# Patient Record
Sex: Female | Born: 1945 | Race: White | Hispanic: No | Marital: Single | Smoking: Never smoker
Health system: Southern US, Community
[De-identification: ages and names within clinical notes are randomized; demographics above are authoritative.]

## PROBLEM LIST (undated history)

## (undated) DIAGNOSIS — E7800 Pure hypercholesterolemia, unspecified: Secondary | ICD-10-CM

## (undated) DIAGNOSIS — I10 Essential (primary) hypertension: Secondary | ICD-10-CM

## (undated) DIAGNOSIS — E039 Hypothyroidism, unspecified: Secondary | ICD-10-CM

## (undated) HISTORY — PX: TONSILLECTOMY: SUR1361

## (undated) HISTORY — PX: DILATION AND CURETTAGE OF UTERUS: SHX78

## (undated) HISTORY — PX: DIAGNOSTIC LAPAROSCOPY: SUR761

---

## 1997-12-06 ENCOUNTER — Other Ambulatory Visit: Admission: RE | Admit: 1997-12-06 | Discharge: 1997-12-06 | Payer: Self-pay | Admitting: Obstetrics and Gynecology

## 1998-12-18 ENCOUNTER — Other Ambulatory Visit: Admission: RE | Admit: 1998-12-18 | Discharge: 1998-12-18 | Payer: Self-pay | Admitting: Obstetrics and Gynecology

## 1999-06-30 ENCOUNTER — Encounter: Admission: RE | Admit: 1999-06-30 | Discharge: 1999-06-30 | Payer: Self-pay | Admitting: Obstetrics and Gynecology

## 1999-06-30 ENCOUNTER — Encounter: Payer: Self-pay | Admitting: Obstetrics and Gynecology

## 1999-12-18 ENCOUNTER — Other Ambulatory Visit: Admission: RE | Admit: 1999-12-18 | Discharge: 1999-12-18 | Payer: Self-pay | Admitting: Obstetrics and Gynecology

## 2000-10-12 ENCOUNTER — Encounter: Payer: Self-pay | Admitting: Obstetrics and Gynecology

## 2000-10-12 ENCOUNTER — Encounter: Admission: RE | Admit: 2000-10-12 | Discharge: 2000-10-12 | Payer: Self-pay | Admitting: Obstetrics and Gynecology

## 2000-12-09 ENCOUNTER — Other Ambulatory Visit: Admission: RE | Admit: 2000-12-09 | Discharge: 2000-12-09 | Payer: Self-pay | Admitting: Obstetrics and Gynecology

## 2001-10-16 ENCOUNTER — Encounter: Admission: RE | Admit: 2001-10-16 | Discharge: 2001-10-16 | Payer: Self-pay | Admitting: Obstetrics and Gynecology

## 2001-10-16 ENCOUNTER — Encounter: Payer: Self-pay | Admitting: Obstetrics and Gynecology

## 2001-12-06 ENCOUNTER — Other Ambulatory Visit: Admission: RE | Admit: 2001-12-06 | Discharge: 2001-12-06 | Payer: Self-pay | Admitting: Obstetrics and Gynecology

## 2002-01-11 ENCOUNTER — Other Ambulatory Visit: Admission: RE | Admit: 2002-01-11 | Discharge: 2002-01-11 | Payer: Self-pay | Admitting: Obstetrics and Gynecology

## 2002-08-27 ENCOUNTER — Other Ambulatory Visit: Admission: RE | Admit: 2002-08-27 | Discharge: 2002-08-27 | Payer: Self-pay | Admitting: Family Medicine

## 2002-09-03 ENCOUNTER — Encounter: Admission: RE | Admit: 2002-09-03 | Discharge: 2002-09-03 | Payer: Self-pay | Admitting: Family Medicine

## 2002-09-03 ENCOUNTER — Encounter: Payer: Self-pay | Admitting: Family Medicine

## 2002-10-18 ENCOUNTER — Encounter: Admission: RE | Admit: 2002-10-18 | Discharge: 2002-10-18 | Payer: Self-pay | Admitting: Family Medicine

## 2002-10-18 ENCOUNTER — Encounter: Payer: Self-pay | Admitting: Family Medicine

## 2003-09-10 ENCOUNTER — Other Ambulatory Visit: Admission: RE | Admit: 2003-09-10 | Discharge: 2003-09-10 | Payer: Self-pay | Admitting: Family Medicine

## 2003-10-21 ENCOUNTER — Encounter: Admission: RE | Admit: 2003-10-21 | Discharge: 2003-10-21 | Payer: Self-pay | Admitting: Family Medicine

## 2003-10-24 ENCOUNTER — Encounter: Admission: RE | Admit: 2003-10-24 | Discharge: 2003-10-24 | Payer: Self-pay | Admitting: Family Medicine

## 2004-07-27 ENCOUNTER — Encounter: Admission: RE | Admit: 2004-07-27 | Discharge: 2004-07-27 | Payer: Self-pay | Admitting: Family Medicine

## 2004-09-10 ENCOUNTER — Other Ambulatory Visit: Admission: RE | Admit: 2004-09-10 | Discharge: 2004-09-10 | Payer: Self-pay | Admitting: Family Medicine

## 2004-09-25 ENCOUNTER — Encounter: Admission: RE | Admit: 2004-09-25 | Discharge: 2004-09-25 | Payer: Self-pay | Admitting: Gastroenterology

## 2004-10-28 ENCOUNTER — Encounter: Admission: RE | Admit: 2004-10-28 | Discharge: 2004-10-28 | Payer: Self-pay | Admitting: Family Medicine

## 2005-11-08 ENCOUNTER — Other Ambulatory Visit: Admission: RE | Admit: 2005-11-08 | Discharge: 2005-11-08 | Payer: Self-pay | Admitting: Family Medicine

## 2005-11-24 ENCOUNTER — Encounter: Admission: RE | Admit: 2005-11-24 | Discharge: 2005-11-24 | Payer: Self-pay | Admitting: Family Medicine

## 2006-11-29 ENCOUNTER — Encounter: Admission: RE | Admit: 2006-11-29 | Discharge: 2006-11-29 | Payer: Self-pay | Admitting: Endocrinology

## 2006-12-02 ENCOUNTER — Other Ambulatory Visit: Admission: RE | Admit: 2006-12-02 | Discharge: 2006-12-02 | Payer: Self-pay | Admitting: Family Medicine

## 2006-12-20 ENCOUNTER — Encounter (INDEPENDENT_AMBULATORY_CARE_PROVIDER_SITE_OTHER): Payer: Self-pay | Admitting: Interventional Radiology

## 2006-12-20 ENCOUNTER — Encounter: Admission: RE | Admit: 2006-12-20 | Discharge: 2006-12-20 | Payer: Self-pay | Admitting: Endocrinology

## 2006-12-20 ENCOUNTER — Other Ambulatory Visit: Admission: RE | Admit: 2006-12-20 | Discharge: 2006-12-20 | Payer: Self-pay | Admitting: Interventional Radiology

## 2007-07-21 ENCOUNTER — Encounter: Admission: RE | Admit: 2007-07-21 | Discharge: 2007-07-21 | Payer: Self-pay | Admitting: Endocrinology

## 2007-11-27 ENCOUNTER — Encounter: Admission: RE | Admit: 2007-11-27 | Discharge: 2007-11-27 | Payer: Self-pay | Admitting: Endocrinology

## 2007-12-05 ENCOUNTER — Encounter: Admission: RE | Admit: 2007-12-05 | Discharge: 2007-12-05 | Payer: Self-pay | Admitting: Family Medicine

## 2007-12-20 ENCOUNTER — Other Ambulatory Visit: Admission: RE | Admit: 2007-12-20 | Discharge: 2007-12-20 | Payer: Self-pay | Admitting: Family Medicine

## 2008-12-06 ENCOUNTER — Encounter: Admission: RE | Admit: 2008-12-06 | Discharge: 2008-12-06 | Payer: Self-pay | Admitting: Family Medicine

## 2008-12-20 IMAGING — US US SOFT TISSUE HEAD/NECK
1 series · 13 of 25 positions shown · non-contrast
Comparison: none

CLINICAL DATA: Goiter.  Please evaluate. 
 THYROID ULTRASOUND:
TECHNIQUE: Ultrasound examination of the thyroid gland and adjacent soft tissue structures was performed.

[Series 1: us soft tissue head/neck · 0.08mm/px · 13 of 45 slices shown]
[im 1/45]
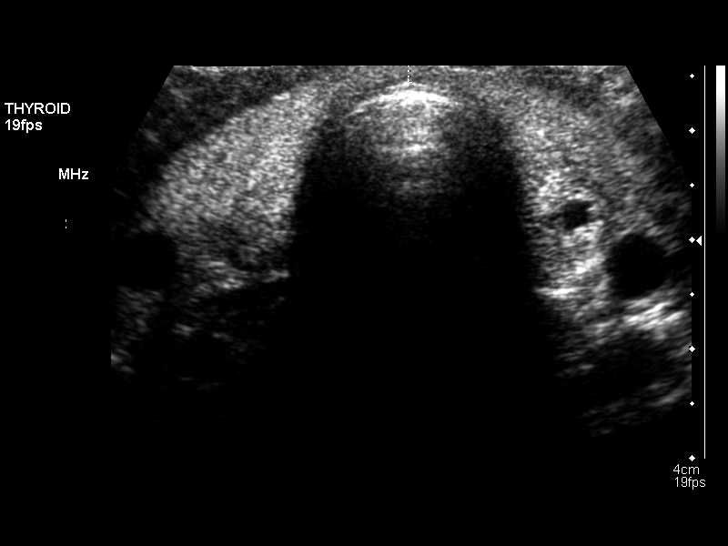
[im 4/45]
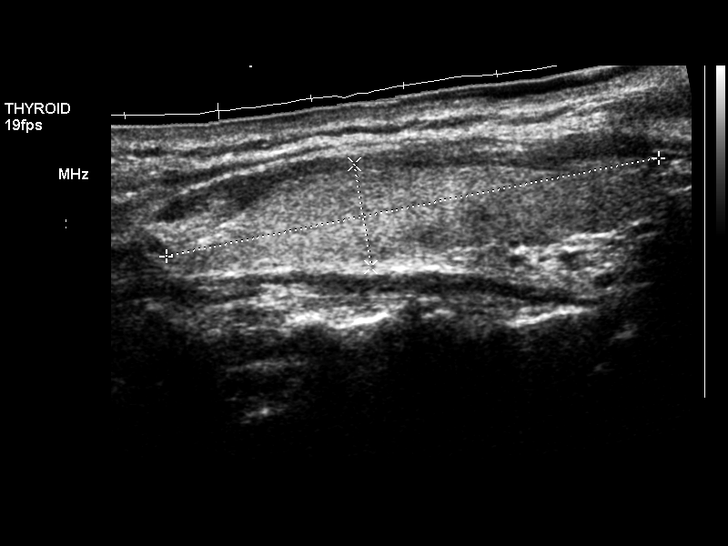
[im 8/45]
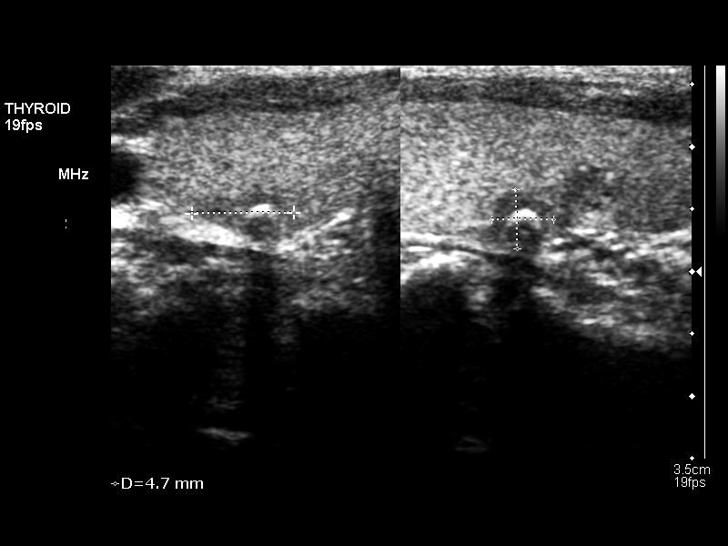
[im 12/45]
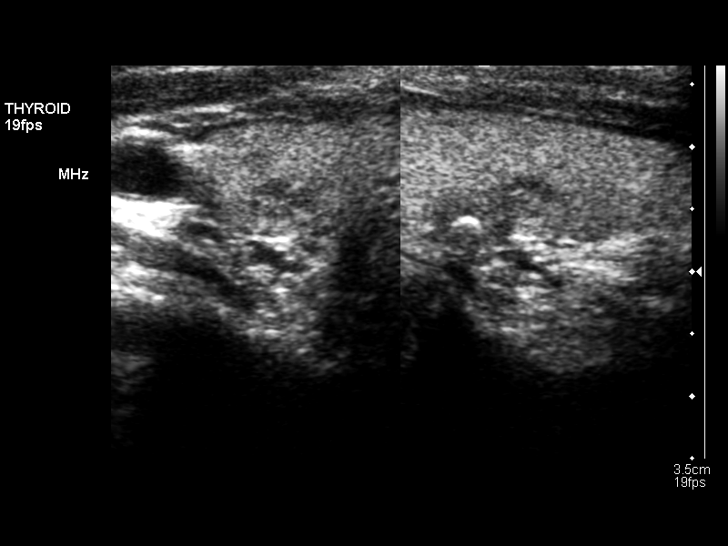
[im 15/45]
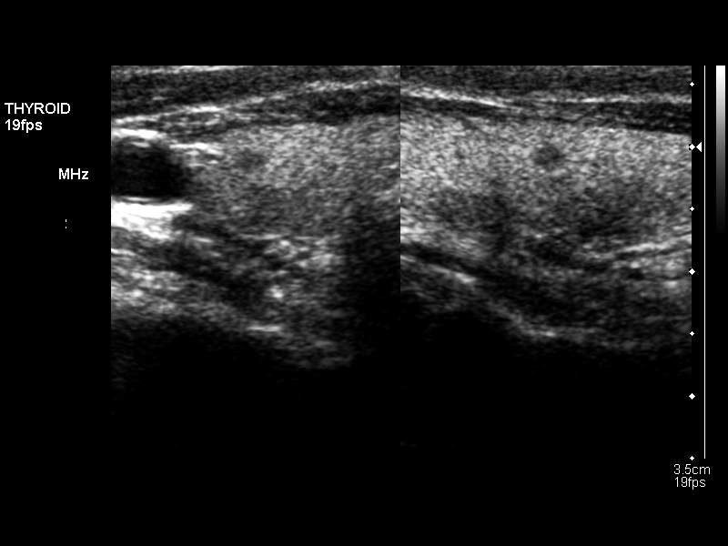
[im 19/45]
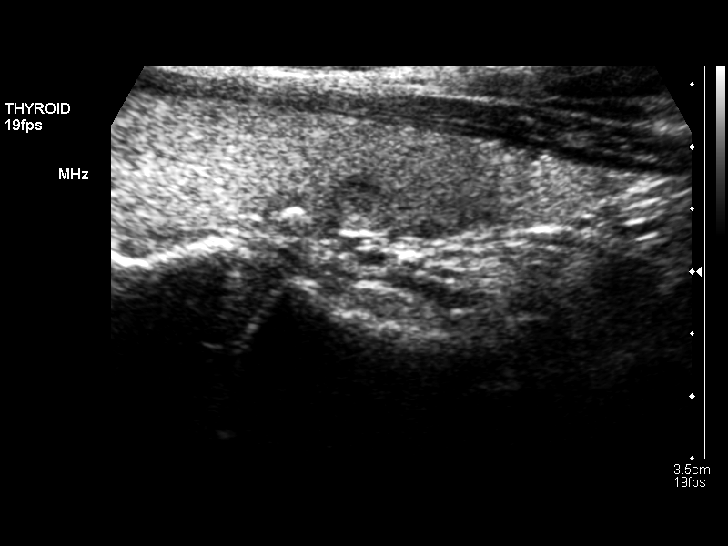
[im 23/45]
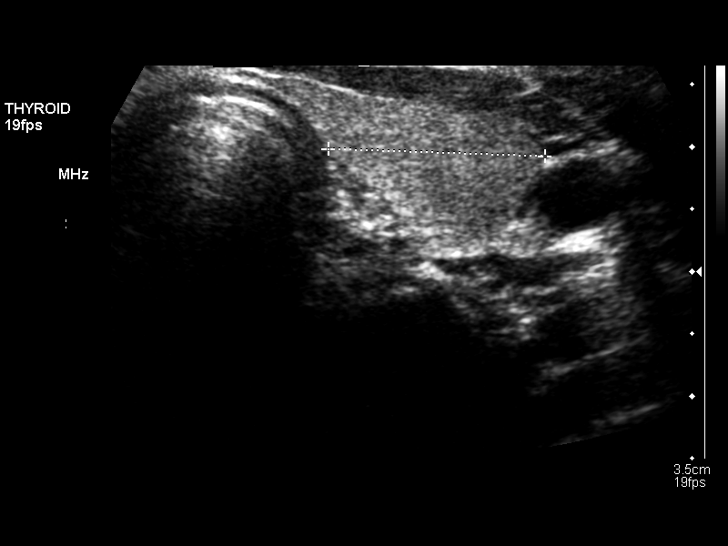
[im 26/45]
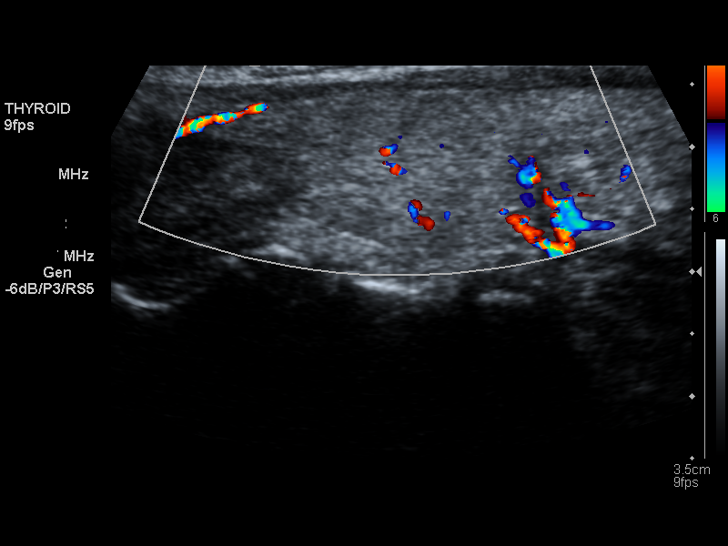
[im 30/45]
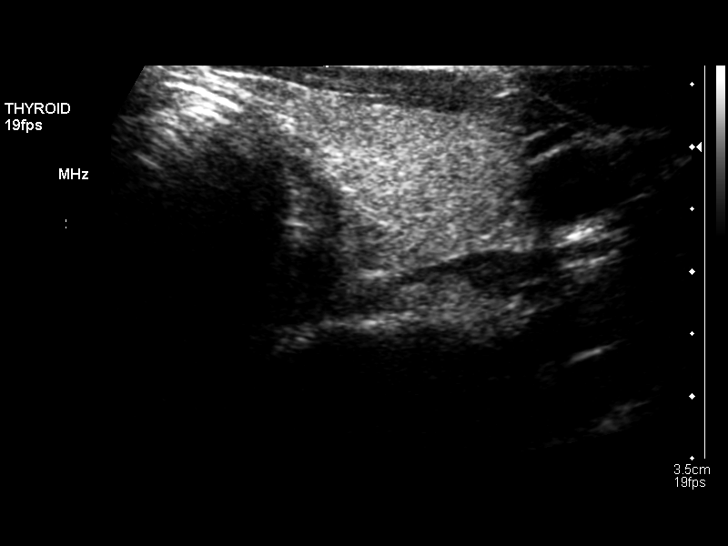
[im 34/45]
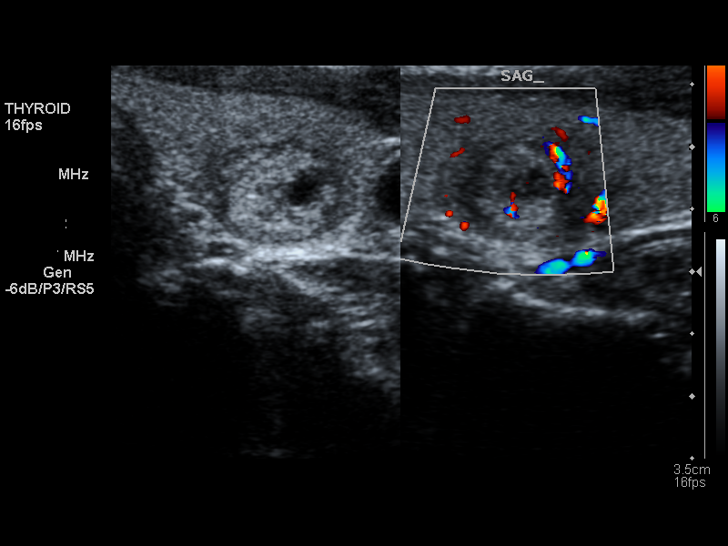
[im 37/45]
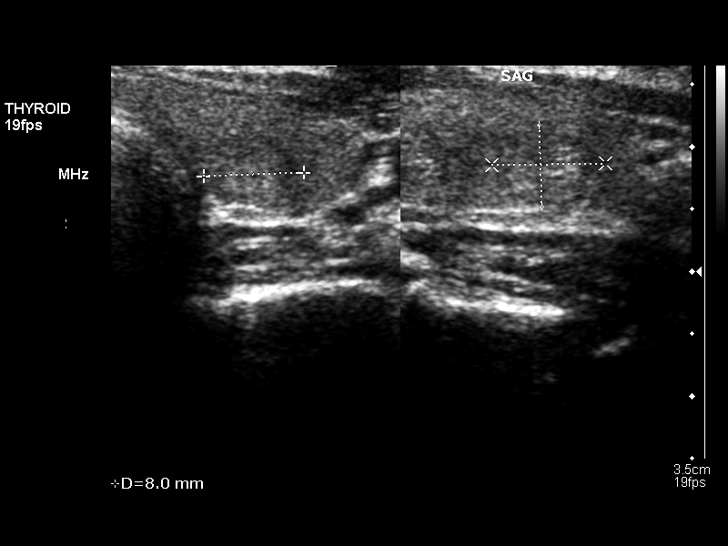
[im 41/45]
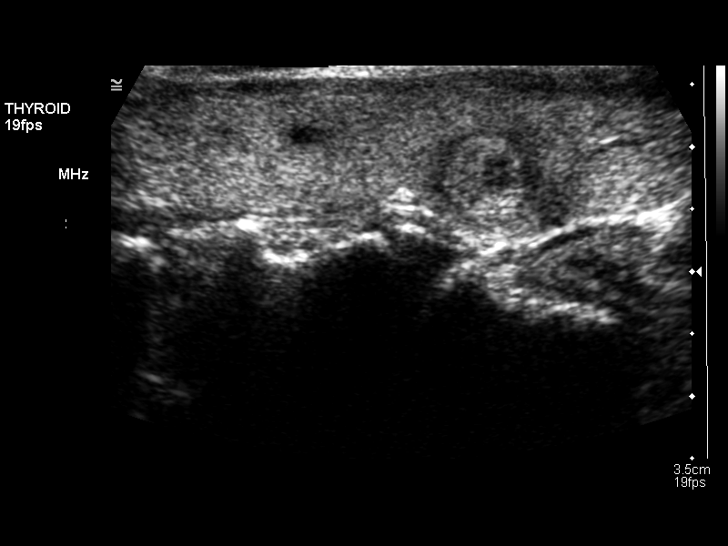
[im 45/45]
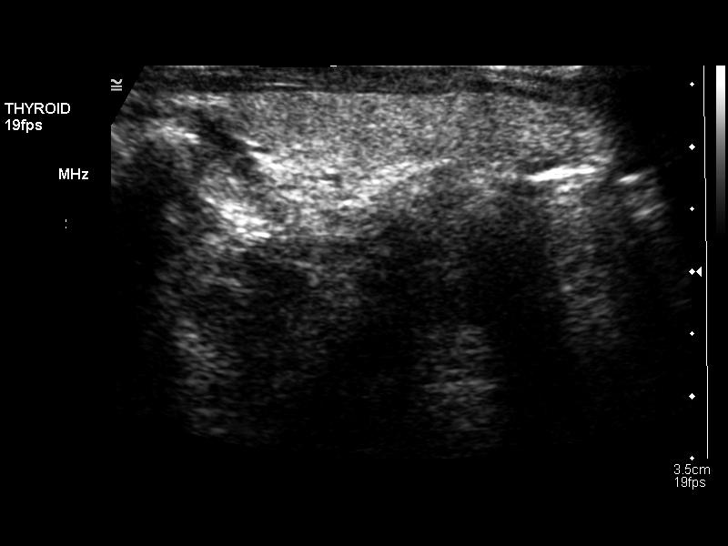

[13 of 25 positions shown; findings below may reference images not displayed]

FINDINGS: There is a multinodular gland present.  The right lobe of the thyroid measures 5.3 x 1.1 x 1.5 cm in size and the left lobe 4.8 x 1.2 x 1.7 cm in size.  Multiple nodules are present within the gland bilaterally.  The largest nodule within the left lobe is within the mid portion of the gland and is a complex nodule measuring 1.4 x 1.2 x 1.0 cm  in size.  A smaller solid nodule measuring 9 x 7 x 8 mm in size is present within the lower pole of the left lobe of the thyroid and there is a small (3 x 4 x 2 mm in size) solid nodule within the upper pole of the left lobe of the thyroid.  The largest nodule within the right lobe of the thyroid is a complex solid nodule within the lower pole laterally located measuring 8 x 5 x 5 mm in size.  This does contain an area of central calcification.  There is a solid 6 x 6 x 6 mm in size nodule within the right lower pole more medially located and a tiny nodule within the mid to lower pole portion measuring 3 x 3 x 3 mm in size.
IMPRESSION: Multinodular gland as discussed above.  The largest nodule is a complex nodule associated with the mid portion of the left lobe of the thyroid and measures 1.4 x 1.2 x 1.0 cm in size.

## 2009-01-02 ENCOUNTER — Other Ambulatory Visit: Admission: RE | Admit: 2009-01-02 | Discharge: 2009-01-02 | Payer: Self-pay | Admitting: Family Medicine

## 2009-04-08 ENCOUNTER — Encounter: Admission: RE | Admit: 2009-04-08 | Discharge: 2009-04-08 | Payer: Self-pay | Admitting: Endocrinology

## 2009-12-23 ENCOUNTER — Encounter
Admission: RE | Admit: 2009-12-23 | Discharge: 2009-12-23 | Payer: Self-pay | Source: Home / Self Care | Attending: Family Medicine | Admitting: Family Medicine

## 2010-01-30 ENCOUNTER — Encounter
Admission: RE | Admit: 2010-01-30 | Discharge: 2010-01-30 | Payer: Self-pay | Source: Home / Self Care | Attending: Endocrinology | Admitting: Endocrinology

## 2010-12-30 ENCOUNTER — Other Ambulatory Visit: Payer: Self-pay | Admitting: Family Medicine

## 2010-12-30 DIAGNOSIS — Z1231 Encounter for screening mammogram for malignant neoplasm of breast: Secondary | ICD-10-CM

## 2011-01-04 ENCOUNTER — Ambulatory Visit
Admission: RE | Admit: 2011-01-04 | Discharge: 2011-01-04 | Disposition: A | Discharge status: Home / Self Care | Payer: 59 | Source: Ambulatory Visit | Attending: Family Medicine | Admitting: Family Medicine

## 2011-01-04 DIAGNOSIS — Z1231 Encounter for screening mammogram for malignant neoplasm of breast: Secondary | ICD-10-CM

## 2011-01-28 ENCOUNTER — Other Ambulatory Visit: Payer: Self-pay | Admitting: Endocrinology

## 2011-01-28 DIAGNOSIS — E041 Nontoxic single thyroid nodule: Secondary | ICD-10-CM

## 2011-02-05 ENCOUNTER — Ambulatory Visit
Admission: RE | Admit: 2011-02-05 | Discharge: 2011-02-05 | Disposition: A | Discharge status: Home / Self Care | Payer: 59 | Source: Ambulatory Visit | Attending: Endocrinology | Admitting: Endocrinology

## 2011-02-05 DIAGNOSIS — E041 Nontoxic single thyroid nodule: Secondary | ICD-10-CM

## 2011-12-10 ENCOUNTER — Other Ambulatory Visit: Payer: Self-pay | Admitting: Family Medicine

## 2011-12-10 DIAGNOSIS — Z1231 Encounter for screening mammogram for malignant neoplasm of breast: Secondary | ICD-10-CM

## 2012-01-12 ENCOUNTER — Ambulatory Visit
Admission: RE | Admit: 2012-01-12 | Discharge: 2012-01-12 | Disposition: A | Discharge status: Home / Self Care | Payer: 59 | Source: Ambulatory Visit | Attending: Family Medicine | Admitting: Family Medicine

## 2012-01-12 DIAGNOSIS — Z1231 Encounter for screening mammogram for malignant neoplasm of breast: Secondary | ICD-10-CM

## 2012-02-15 ENCOUNTER — Other Ambulatory Visit: Payer: Self-pay | Admitting: Endocrinology

## 2012-02-15 DIAGNOSIS — E049 Nontoxic goiter, unspecified: Secondary | ICD-10-CM

## 2012-03-01 ENCOUNTER — Ambulatory Visit
Admission: RE | Admit: 2012-03-01 | Discharge: 2012-03-01 | Disposition: A | Discharge status: Home / Self Care | Payer: 59 | Source: Ambulatory Visit | Attending: Endocrinology | Admitting: Endocrinology

## 2012-03-01 DIAGNOSIS — E049 Nontoxic goiter, unspecified: Secondary | ICD-10-CM

## 2012-12-06 ENCOUNTER — Other Ambulatory Visit: Payer: Self-pay

## 2012-12-06 DIAGNOSIS — Z1231 Encounter for screening mammogram for malignant neoplasm of breast: Secondary | ICD-10-CM

## 2013-01-12 ENCOUNTER — Ambulatory Visit
Admission: RE | Admit: 2013-01-12 | Discharge: 2013-01-12 | Disposition: A | Discharge status: Home / Self Care | Payer: Medicare Other | Source: Ambulatory Visit

## 2013-01-12 DIAGNOSIS — Z1231 Encounter for screening mammogram for malignant neoplasm of breast: Secondary | ICD-10-CM

## 2013-03-27 ENCOUNTER — Other Ambulatory Visit (HOSPITAL_COMMUNITY): Payer: Self-pay | Admitting: Endocrinology

## 2013-03-27 DIAGNOSIS — E041 Nontoxic single thyroid nodule: Secondary | ICD-10-CM

## 2013-04-16 ENCOUNTER — Ambulatory Visit (HOSPITAL_COMMUNITY)
Admission: RE | Admit: 2013-04-16 | Discharge: 2013-04-16 | Disposition: A | Discharge status: Home / Self Care | Payer: Medicare Other | Source: Ambulatory Visit | Attending: Endocrinology | Admitting: Endocrinology

## 2013-04-16 DIAGNOSIS — E041 Nontoxic single thyroid nodule: Secondary | ICD-10-CM | POA: Insufficient documentation

## 2013-04-16 MED ORDER — SODIUM PERTECHNETATE TC 99M INJECTION
10.0000 | Freq: Once | INTRAVENOUS | Status: AC | PRN
  Administered 2013-04-16: 10 via INTRAVENOUS

## 2013-08-31 ENCOUNTER — Other Ambulatory Visit (HOSPITAL_COMMUNITY): Payer: Self-pay | Admitting: Endocrinology

## 2013-08-31 DIAGNOSIS — E049 Nontoxic goiter, unspecified: Secondary | ICD-10-CM

## 2013-11-01 ENCOUNTER — Ambulatory Visit (HOSPITAL_COMMUNITY)
Admission: RE | Admit: 2013-11-01 | Discharge: 2013-11-01 | Disposition: A | Discharge status: Home / Self Care | Payer: Medicare Other | Source: Ambulatory Visit | Attending: Endocrinology | Admitting: Endocrinology

## 2013-11-01 DIAGNOSIS — R599 Enlarged lymph nodes, unspecified: Secondary | ICD-10-CM | POA: Diagnosis not present

## 2013-11-01 DIAGNOSIS — E041 Nontoxic single thyroid nodule: Secondary | ICD-10-CM | POA: Insufficient documentation

## 2013-11-01 DIAGNOSIS — E049 Nontoxic goiter, unspecified: Secondary | ICD-10-CM

## 2013-11-07 ENCOUNTER — Other Ambulatory Visit: Payer: Self-pay | Admitting: Endocrinology

## 2013-11-07 DIAGNOSIS — E041 Nontoxic single thyroid nodule: Secondary | ICD-10-CM

## 2013-11-15 ENCOUNTER — Other Ambulatory Visit (HOSPITAL_COMMUNITY)
Admission: RE | Admit: 2013-11-15 | Discharge: 2013-11-15 | Disposition: A | Discharge status: Home / Self Care | Payer: Medicare Other | Source: Ambulatory Visit | Attending: Interventional Radiology | Admitting: Interventional Radiology

## 2013-11-15 ENCOUNTER — Ambulatory Visit
Admission: RE | Admit: 2013-11-15 | Discharge: 2013-11-15 | Disposition: A | Discharge status: Home / Self Care | Payer: Medicare Other | Source: Ambulatory Visit | Attending: Endocrinology | Admitting: Endocrinology

## 2013-11-15 DIAGNOSIS — E041 Nontoxic single thyroid nodule: Secondary | ICD-10-CM | POA: Insufficient documentation

## 2013-12-31 ENCOUNTER — Other Ambulatory Visit: Payer: Self-pay

## 2013-12-31 DIAGNOSIS — Z1231 Encounter for screening mammogram for malignant neoplasm of breast: Secondary | ICD-10-CM

## 2014-01-14 ENCOUNTER — Ambulatory Visit
Admission: RE | Admit: 2014-01-14 | Discharge: 2014-01-14 | Disposition: A | Discharge status: Home / Self Care | Payer: Medicare Other | Source: Ambulatory Visit

## 2014-01-14 DIAGNOSIS — Z1231 Encounter for screening mammogram for malignant neoplasm of breast: Secondary | ICD-10-CM

## 2014-03-14 ENCOUNTER — Other Ambulatory Visit: Payer: Self-pay | Admitting: Gastroenterology

## 2014-04-11 ENCOUNTER — Encounter (HOSPITAL_COMMUNITY): Payer: Self-pay | Admitting: *Deleted

## 2014-04-23 ENCOUNTER — Encounter (HOSPITAL_COMMUNITY)
Admission: RE | Disposition: A | Discharge status: Home / Self Care | Payer: Self-pay | Source: Ambulatory Visit | Attending: Gastroenterology

## 2014-04-23 ENCOUNTER — Ambulatory Visit (HOSPITAL_COMMUNITY)
Admission: RE | Admit: 2014-04-23 | Discharge: 2014-04-23 | Disposition: A | Discharge status: Home / Self Care | Payer: Medicare Other | Source: Ambulatory Visit | Attending: Gastroenterology | Admitting: Gastroenterology

## 2014-04-23 ENCOUNTER — Encounter (HOSPITAL_COMMUNITY): Payer: Self-pay | Admitting: Gastroenterology

## 2014-04-23 ENCOUNTER — Ambulatory Visit (HOSPITAL_COMMUNITY): Payer: Medicare Other | Admitting: Anesthesiology

## 2014-04-23 DIAGNOSIS — K219 Gastro-esophageal reflux disease without esophagitis: Secondary | ICD-10-CM | POA: Diagnosis not present

## 2014-04-23 DIAGNOSIS — I10 Essential (primary) hypertension: Secondary | ICD-10-CM | POA: Insufficient documentation

## 2014-04-23 DIAGNOSIS — Z1211 Encounter for screening for malignant neoplasm of colon: Secondary | ICD-10-CM | POA: Diagnosis present

## 2014-04-23 DIAGNOSIS — N3010 Interstitial cystitis (chronic) without hematuria: Secondary | ICD-10-CM | POA: Diagnosis not present

## 2014-04-23 DIAGNOSIS — E041 Nontoxic single thyroid nodule: Secondary | ICD-10-CM | POA: Diagnosis not present

## 2014-04-23 HISTORY — DX: Essential (primary) hypertension: I10

## 2014-04-23 HISTORY — PX: COLONOSCOPY WITH PROPOFOL: SHX5780

## 2014-04-23 HISTORY — DX: Hypothyroidism, unspecified: E03.9

## 2014-04-23 HISTORY — DX: Pure hypercholesterolemia, unspecified: E78.00

## 2014-04-23 SURGERY — COLONOSCOPY WITH PROPOFOL
Anesthesia: Monitor Anesthesia Care

## 2014-04-23 MED ORDER — PROPOFOL 10 MG/ML IV BOLUS
INTRAVENOUS | Status: AC
  Filled 2014-04-23: qty 20

## 2014-04-23 MED ORDER — SODIUM CHLORIDE 0.9 % IV SOLN
INTRAVENOUS | Status: DC
Start: 2014-04-23 — End: 2014-04-23

## 2014-04-23 MED ORDER — LACTATED RINGERS IV SOLN
INTRAVENOUS | Status: DC
  Administered 2014-04-23: 1000 mL via INTRAVENOUS

## 2014-04-23 MED ORDER — PROPOFOL 10 MG/ML IV BOLUS
INTRAVENOUS | Status: DC | PRN
Start: 2014-04-23 — End: 2014-04-23
  Administered 2014-04-23: 40 mg via INTRAVENOUS
  Administered 2014-04-23 (×3): 20 mg via INTRAVENOUS
  Administered 2014-04-23: 10 mg via INTRAVENOUS
  Administered 2014-04-23: 20 mg via INTRAVENOUS

## 2014-04-23 MED ORDER — PROPOFOL INFUSION 10 MG/ML OPTIME
INTRAVENOUS | Status: DC | PRN
  Administered 2014-04-23: 75 ug/kg/min via INTRAVENOUS

## 2014-04-23 SURGICAL SUPPLY — 21 items

## 2014-04-23 NOTE — H&P (Signed)
  Procedure: Baseline screening colonoscopy  History: The patient is a 69 year old female born [birth year]. She is scheduled to undergo her first screening colonoscopy with polypectomy to prevent colon cancer.  Medication allergies: Simvastatin caused myalgias  Past medical history: Hypertension. Interstitial cystitis. Gastroesophageal reflux. Thyroid nodule  Exam: The patient is alert and lying comfortably on the endoscopy stretcher. Abdomen is soft and nontender to palpation. Lungs are clear to auscultation. Cardiac exam reveals a regular rhythm.  Plan: Proceed with baseline screening colonoscopy

## 2014-04-23 NOTE — Anesthesia Postprocedure Evaluation (Signed)
  Anesthesia Post-op Note  Patient: Bailey Burns  Procedure(s) Performed: Procedure(s) (LRB): COLONOSCOPY WITH PROPOFOL (N/A)  Patient Location: PACU  Anesthesia Type: MAC  Level of Consciousness: awake and alert   Airway and Oxygen Therapy: Patient Spontanous Breathing  Post-op Pain: mild  Post-op Assessment: Post-op Vital signs reviewed, Patient's Cardiovascular Status Stable, Respiratory Function Stable, Patent Airway and No signs of Nausea or vomiting  Last Vitals:  Filed Vitals:   04/23/14 0830  BP:   Pulse: 67  Temp:   Resp: 17    Post-op Vital Signs: stable   Complications: No apparent anesthesia complications

## 2014-04-23 NOTE — Discharge Instructions (Signed)
Colonoscopy, Care After °These instructions give you information on caring for yourself after your procedure. Your doctor may also give you more specific instructions. Call your doctor if you have any problems or questions after your procedure. °HOME CARE °· Do not drive for 24 hours. °· Do not sign important papers or use machinery for 24 hours. °· You may shower. °· You may go back to your usual activities, but go slower for the first 24 hours. °· Take rest breaks often during the first 24 hours. °· Walk around or use warm packs on your belly (abdomen) if you have belly cramping or gas. °· Drink enough fluids to keep your pee (urine) clear or pale yellow. °· Resume your normal diet. Avoid heavy or fried foods. °· Avoid drinking alcohol for 24 hours or as told by your doctor. °· Only take medicines as told by your doctor. °If a tissue sample (biopsy) was taken during the procedure:  °· Do not take aspirin or blood thinners for 7 days, or as told by your doctor. °· Do not drink alcohol for 7 days, or as told by your doctor. °· Eat soft foods for the first 24 hours. °GET HELP IF: °You still have a small amount of blood in your poop (stool) 2-3 days after the procedure. °GET HELP RIGHT AWAY IF: °· You have more than a small amount of blood in your poop. °· You see clumps of tissue (blood clots) in your poop. °· Your belly is puffy (swollen). °· You feel sick to your stomach (nauseous) or throw up (vomit). °· You have a fever. °· You have belly pain that gets worse and medicine does not help. °MAKE SURE YOU: °· Understand these instructions. °· Will watch your condition. °· Will get help right away if you are not doing well or get worse. °Document Released: 01/23/2010 Document Revised: 12/26/2012 Document Reviewed: 08/28/2012 °[employer]® Patient Information ©2015 [employer]. This information is not intended to replace advice given to you by your health care provider. Make sure you discuss any questions you have with  your health care provider. ° °

## 2014-04-23 NOTE — Anesthesia Preprocedure Evaluation (Signed)
Anesthesia Evaluation  Patient identified by MRN, date of birth, ID band Patient awake    Reviewed: Allergy & Precautions, NPO status , Patient's Chart, lab work & pertinent test results  Airway Mallampati: II  TM Distance: >3 FB Neck ROM: Full    Dental no notable dental hx.    Pulmonary neg pulmonary ROS,  breath sounds clear to auscultation  Pulmonary exam normal       Cardiovascular hypertension, Pt. on medications Rhythm:Regular Rate:Normal     Neuro/Psych negative neurological ROS  negative psych ROS   GI/Hepatic negative GI ROS, Neg liver ROS,   Endo/Other  Hypothyroidism   Renal/GU negative Renal ROS  negative genitourinary   Musculoskeletal negative musculoskeletal ROS (+)   Abdominal   Peds negative pediatric ROS (+)  Hematology negative hematology ROS (+)   Anesthesia Other Findings   Reproductive/Obstetrics negative OB ROS                             Anesthesia Physical Anesthesia Plan  ASA: II  Anesthesia Plan: MAC   Post-op Pain Management:    Induction:   Airway Management Planned: Simple Face Mask  Additional Equipment:   Intra-op Plan:   Post-operative Plan:   Informed Consent: I have reviewed the patients History and Physical, chart, labs and discussed the procedure including the risks, benefits and alternatives for the proposed anesthesia with the patient or authorized representative who has indicated his/her understanding and acceptance.   Dental advisory given  Plan Discussed with: CRNA  Anesthesia Plan Comments:         Anesthesia Quick Evaluation

## 2014-04-23 NOTE — Op Note (Signed)
Procedure: Baseline screening colonoscopy  Endoscopist: Earle Gell  Premedication: Propofol administered by anesthesia  Procedure: The patient was placed in the left lateral decubitus position. Anal inspection and digital rectal exam were normal. The Pentax pediatric colonoscope was introduced into the rectum and advanced to the cecum. A normal-appearing ileocecal valve was identified. A normal-appearing appendiceal orifice was identified. Colonic preparation for the exam today was good. Withdrawal time was 8 minutes  Rectum. Normal. Retroflexed view of the distal rectum normal  Sigmoid colon and descending colon. Normal  Splenic flexure. Normal  Transverse colon. Normal  Hepatic flexure. Normal  Ascending colon. Normal  Cecum and ileocecal valve. Normal  Assessment: Normal baseline screening colonoscopy  Recommendation: Schedule screening colonoscopy in 10 years

## 2014-04-23 NOTE — Transfer of Care (Signed)
Immediate Anesthesia Transfer of Care Note  Patient: Bailey Burns  Procedure(s) Performed: Procedure(s): COLONOSCOPY WITH PROPOFOL (N/A)  Patient Location: PACU  Anesthesia Type:MAC  Level of Consciousness:  sedated, patient cooperative and responds to stimulation  Airway & Oxygen Therapy:Patient Spontanous Breathing and Patient connected to face mask oxgen  Post-op Assessment:  Report given to PACU RN and Post -op Vital signs reviewed and stable  Post vital signs:  Reviewed and stable  Last Vitals:  Filed Vitals:   04/23/14 0705  BP: 166/87  Pulse: 83  Temp: 36.6 C  Resp: 17    Complications: No apparent anesthesia complications

## 2014-04-24 ENCOUNTER — Encounter (HOSPITAL_COMMUNITY): Payer: Self-pay | Admitting: Gastroenterology

## 2015-01-31 ENCOUNTER — Other Ambulatory Visit: Payer: Self-pay

## 2015-01-31 DIAGNOSIS — Z1231 Encounter for screening mammogram for malignant neoplasm of breast: Secondary | ICD-10-CM

## 2015-03-06 ENCOUNTER — Ambulatory Visit
Admission: RE | Admit: 2015-03-06 | Discharge: 2015-03-06 | Disposition: A | Discharge status: Home / Self Care | Payer: Medicare Other | Source: Ambulatory Visit

## 2015-03-06 DIAGNOSIS — Z1231 Encounter for screening mammogram for malignant neoplasm of breast: Secondary | ICD-10-CM

## 2015-03-27 ENCOUNTER — Other Ambulatory Visit: Payer: Self-pay

## 2015-03-27 DIAGNOSIS — C4492 Squamous cell carcinoma of skin, unspecified: Secondary | ICD-10-CM

## 2015-03-27 HISTORY — DX: Squamous cell carcinoma of skin, unspecified: C44.92

## 2015-05-09 ENCOUNTER — Other Ambulatory Visit: Payer: Self-pay | Admitting: Endocrinology

## 2015-05-09 DIAGNOSIS — E049 Nontoxic goiter, unspecified: Secondary | ICD-10-CM

## 2015-08-11 ENCOUNTER — Other Ambulatory Visit: Payer: Self-pay

## 2015-08-11 DIAGNOSIS — C4491 Basal cell carcinoma of skin, unspecified: Secondary | ICD-10-CM

## 2015-08-11 HISTORY — DX: Basal cell carcinoma of skin, unspecified: C44.91

## 2015-09-25 ENCOUNTER — Other Ambulatory Visit: Payer: Self-pay | Admitting: Dermatology

## 2015-10-10 ENCOUNTER — Ambulatory Visit
Admission: RE | Admit: 2015-10-10 | Discharge: 2015-10-10 | Disposition: A | Discharge status: Home / Self Care | Payer: Medicare Other | Source: Ambulatory Visit | Attending: Endocrinology | Admitting: Endocrinology

## 2015-10-10 DIAGNOSIS — E049 Nontoxic goiter, unspecified: Secondary | ICD-10-CM

## 2016-02-10 ENCOUNTER — Other Ambulatory Visit: Payer: Self-pay | Admitting: Family Medicine

## 2016-02-10 DIAGNOSIS — Z1231 Encounter for screening mammogram for malignant neoplasm of breast: Secondary | ICD-10-CM

## 2016-03-16 ENCOUNTER — Ambulatory Visit
Admission: RE | Admit: 2016-03-16 | Discharge: 2016-03-16 | Disposition: A | Discharge status: Home / Self Care | Payer: Medicare Other | Source: Ambulatory Visit | Attending: Family Medicine | Admitting: Family Medicine

## 2016-03-16 DIAGNOSIS — Z1231 Encounter for screening mammogram for malignant neoplasm of breast: Secondary | ICD-10-CM

## 2017-03-16 ENCOUNTER — Other Ambulatory Visit: Payer: Self-pay | Admitting: Family Medicine

## 2017-03-16 DIAGNOSIS — Z139 Encounter for screening, unspecified: Secondary | ICD-10-CM

## 2017-04-04 ENCOUNTER — Ambulatory Visit
Admission: RE | Admit: 2017-04-04 | Discharge: 2017-04-04 | Disposition: A | Discharge status: Home / Self Care | Payer: Medicare Other | Source: Ambulatory Visit | Attending: Family Medicine | Admitting: Family Medicine

## 2017-04-04 DIAGNOSIS — Z139 Encounter for screening, unspecified: Secondary | ICD-10-CM

## 2017-04-26 ENCOUNTER — Other Ambulatory Visit: Payer: Self-pay | Admitting: Endocrinology

## 2017-04-26 DIAGNOSIS — E049 Nontoxic goiter, unspecified: Secondary | ICD-10-CM

## 2017-05-04 ENCOUNTER — Ambulatory Visit
Admission: RE | Admit: 2017-05-04 | Discharge: 2017-05-04 | Disposition: A | Discharge status: Home / Self Care | Payer: Medicare Other | Source: Ambulatory Visit | Attending: Endocrinology | Admitting: Endocrinology

## 2017-05-04 DIAGNOSIS — E049 Nontoxic goiter, unspecified: Secondary | ICD-10-CM

## 2018-05-31 ENCOUNTER — Other Ambulatory Visit: Payer: Self-pay | Admitting: Family Medicine

## 2018-05-31 DIAGNOSIS — Z9289 Personal history of other medical treatment: Secondary | ICD-10-CM

## 2018-07-17 ENCOUNTER — Ambulatory Visit
Admission: RE | Admit: 2018-07-17 | Discharge: 2018-07-17 | Disposition: A | Discharge status: Home / Self Care | Payer: Medicare Other | Source: Ambulatory Visit | Attending: Family Medicine | Admitting: Family Medicine

## 2018-07-17 DIAGNOSIS — Z9289 Personal history of other medical treatment: Secondary | ICD-10-CM

## 2018-11-06 ENCOUNTER — Other Ambulatory Visit: Payer: Self-pay

## 2018-11-06 DIAGNOSIS — C4491 Basal cell carcinoma of skin, unspecified: Secondary | ICD-10-CM

## 2018-11-06 HISTORY — DX: Basal cell carcinoma of skin, unspecified: C44.91

## 2019-01-23 ENCOUNTER — Ambulatory Visit: Payer: Medicare Other | Attending: Internal Medicine

## 2019-01-23 DIAGNOSIS — Z23 Encounter for immunization: Secondary | ICD-10-CM | POA: Insufficient documentation

## 2019-01-23 NOTE — Progress Notes (Signed)
   Covid-19 Vaccination Clinic  Name:  Bailey Burns    MRN: [id number] DOB: 03/21/1945  01/23/2019  [name] was observed post Covid-19 immunization for 15 minutes without incidence. She was provided with Vaccine Information Sheet and instruction to access the V-Safe system.   [name] was instructed to call 911 with any severe reactions post vaccine: Marland Kitchen Difficulty breathing  . Swelling of your face and throat  . A fast heartbeat  . A bad rash all over your body  . Dizziness and weakness    Immunizations Administered    Name Date Dose VIS Date Route   Pfizer COVID-19 Vaccine 01/23/2019  3:52 PM 0.3 mL 12/15/2018 Intramuscular   Manufacturer: [employer]   Lot: [id number]   [place]: [id number]

## 2019-02-10 ENCOUNTER — Ambulatory Visit: Payer: Medicare Other

## 2019-02-13 ENCOUNTER — Ambulatory Visit: Payer: Medicare Other | Attending: Internal Medicine

## 2019-02-13 DIAGNOSIS — Z23 Encounter for immunization: Secondary | ICD-10-CM | POA: Insufficient documentation

## 2019-02-13 NOTE — Progress Notes (Signed)
   Covid-19 Vaccination Clinic  Name:  Bailey Burns    MRN: [id number] DOB: 1945-12-07  02/13/2019  Bailey Burns was observed post Covid-19 immunization for 15 minutes without incidence. She was provided with Vaccine Information Sheet and instruction to access the V-Safe system.   Bailey Burns was instructed to call 911 with any severe reactions post vaccine: Marland Kitchen Difficulty breathing  . Swelling of your face and throat  . A fast heartbeat  . A bad rash all over your body  . Dizziness and weakness    Immunizations Administered    Name Date Dose VIS Date Route   Pfizer COVID-19 Vaccine 02/13/2019 10:38 AM 0.3 mL 12/15/2018 Intramuscular   Manufacturer: [place]   Lot: [id number]   [place]: [id number]

## 2019-02-27 ENCOUNTER — Other Ambulatory Visit: Payer: Self-pay | Admitting: Endocrinology

## 2019-02-27 DIAGNOSIS — E049 Nontoxic goiter, unspecified: Secondary | ICD-10-CM

## 2019-03-06 ENCOUNTER — Other Ambulatory Visit: Payer: Medicare Other

## 2019-04-25 ENCOUNTER — Encounter: Payer: Self-pay | Admitting: *Deleted

## 2019-04-30 ENCOUNTER — Ambulatory Visit
Admission: RE | Admit: 2019-04-30 | Discharge: 2019-04-30 | Disposition: A | Discharge status: Home / Self Care | Payer: Medicare Other | Source: Ambulatory Visit | Attending: Endocrinology | Admitting: Endocrinology

## 2019-04-30 DIAGNOSIS — E049 Nontoxic goiter, unspecified: Secondary | ICD-10-CM

## 2019-05-22 ENCOUNTER — Other Ambulatory Visit: Payer: Self-pay | Admitting: Endocrinology

## 2019-05-22 DIAGNOSIS — E049 Nontoxic goiter, unspecified: Secondary | ICD-10-CM

## 2019-06-21 ENCOUNTER — Other Ambulatory Visit: Payer: Self-pay | Admitting: Family Medicine

## 2019-06-21 DIAGNOSIS — Z1231 Encounter for screening mammogram for malignant neoplasm of breast: Secondary | ICD-10-CM

## 2019-07-24 ENCOUNTER — Ambulatory Visit
Admission: RE | Admit: 2019-07-24 | Discharge: 2019-07-24 | Disposition: A | Discharge status: Home / Self Care | Payer: Medicare Other | Source: Ambulatory Visit | Attending: Family Medicine | Admitting: Family Medicine

## 2019-07-24 ENCOUNTER — Other Ambulatory Visit: Payer: Self-pay

## 2019-07-24 DIAGNOSIS — Z1231 Encounter for screening mammogram for malignant neoplasm of breast: Secondary | ICD-10-CM

## 2019-08-04 IMAGING — US US THYROID
1 series · 13 of 25 positions shown · non-contrast
Comparison: 10/10/2015 and earlier studies

CLINICAL DATA: Nontoxic goiter. Previous FNA biopsy left nodule
11/15/2013.

EXAM:
THYROID ULTRASOUND
TECHNIQUE: Ultrasound examination of the thyroid gland and adjacent soft
tissues was performed.

[Series 1: us thyroid · 0.05mm/px · 13 of 83 slices shown]
[im 1/83]
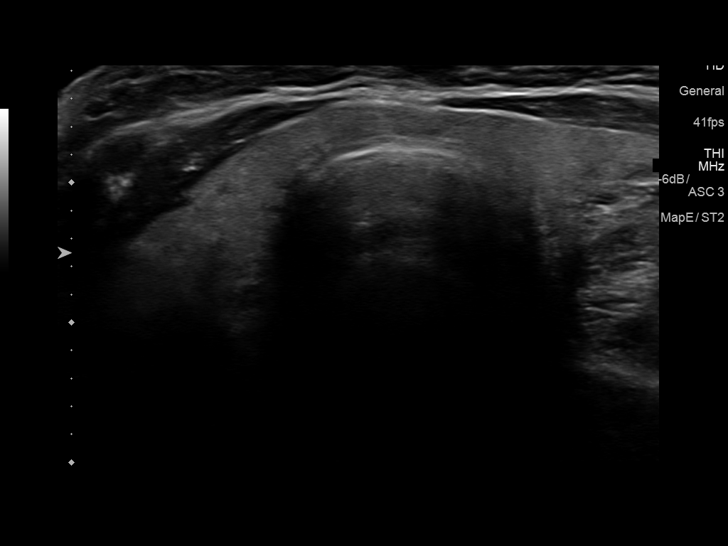
[im 7/83]
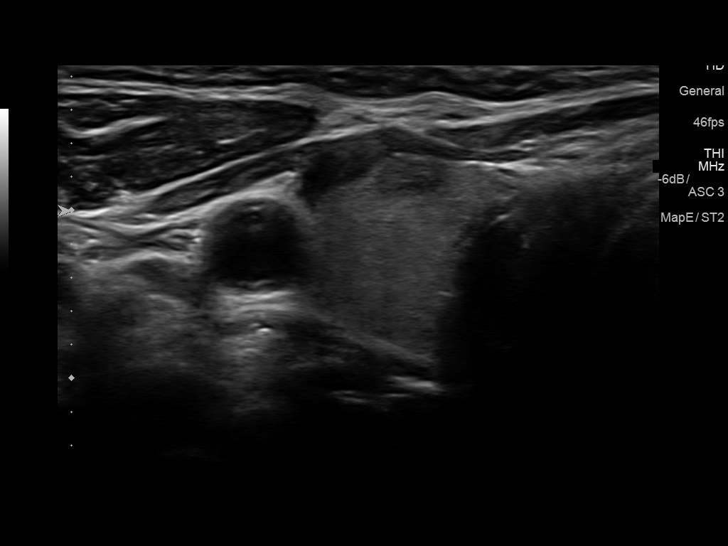
[im 14/83]
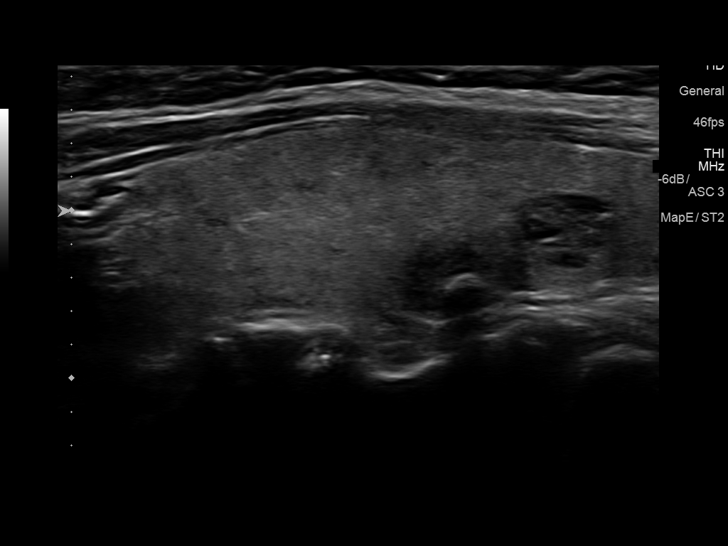
[im 21/83]
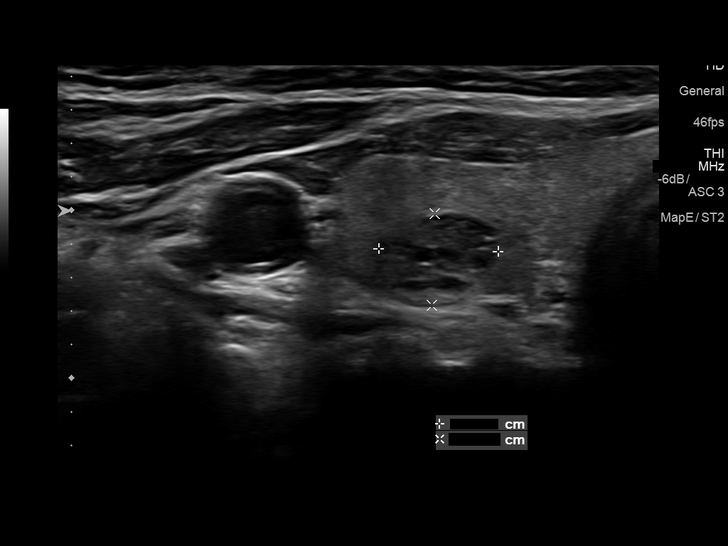
[im 28/83]
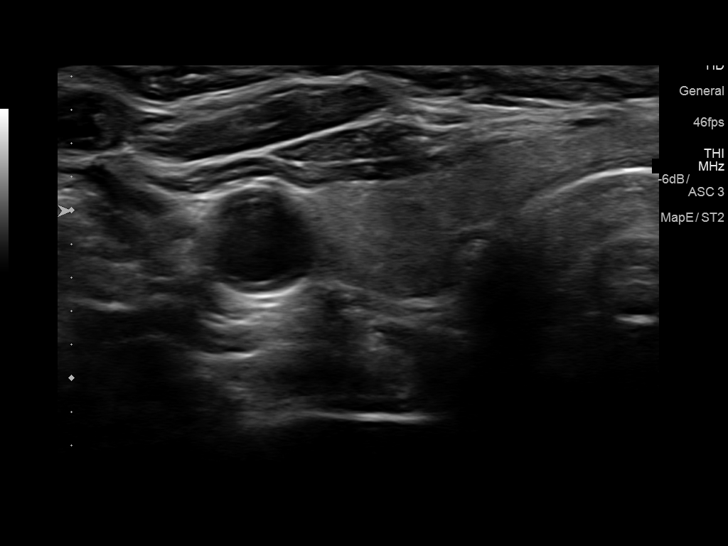
[im 35/83]
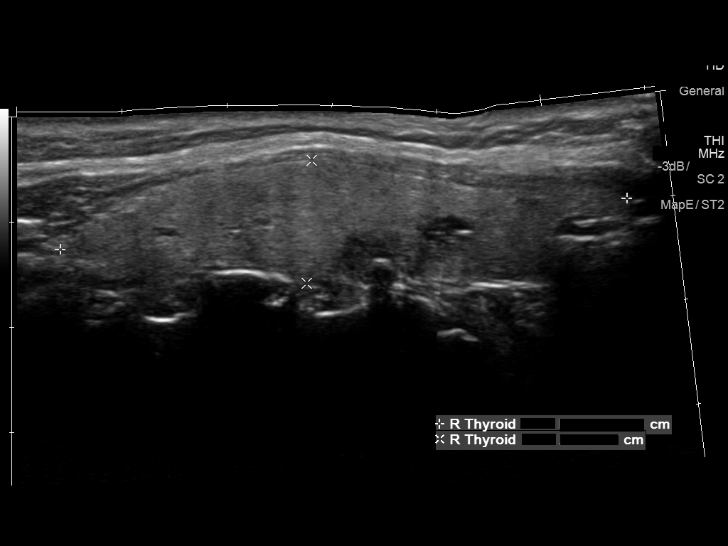
[im 42/83]
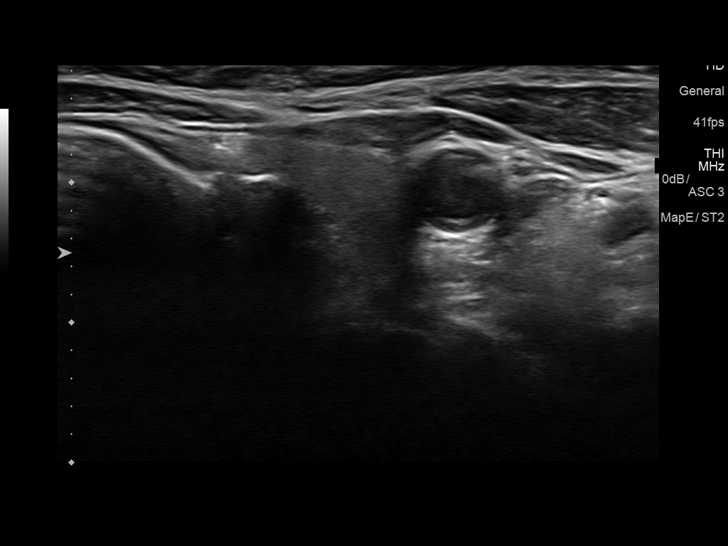
[im 48/83]
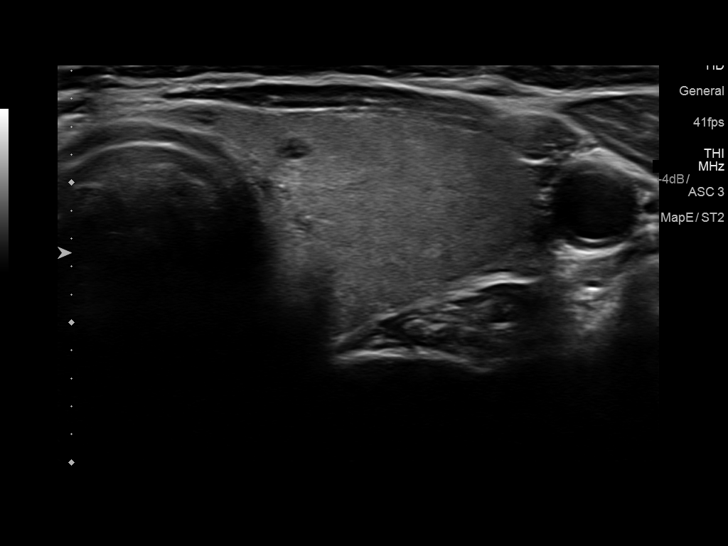
[im 55/83]
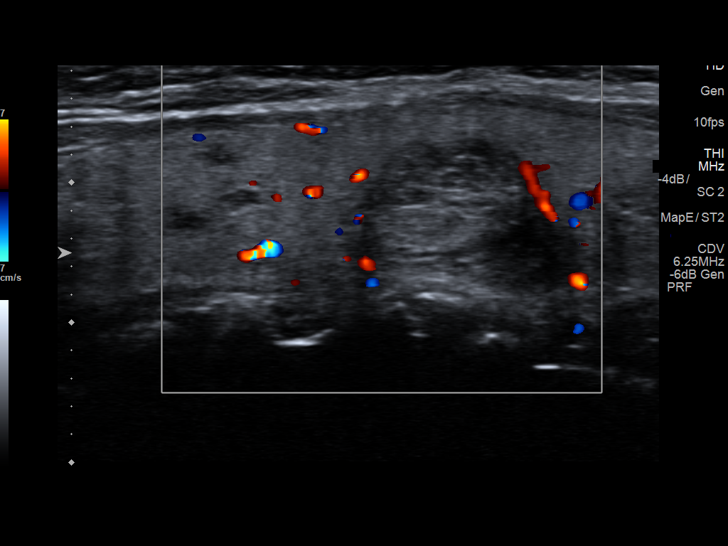
[im 62/83]
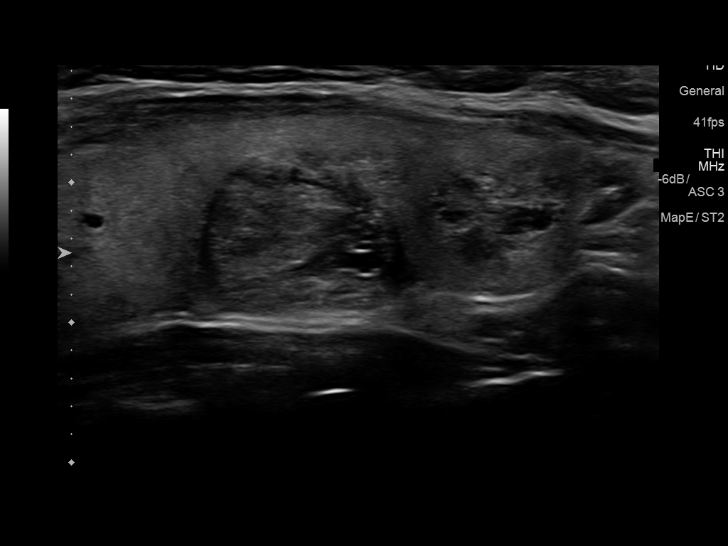
[im 69/83]
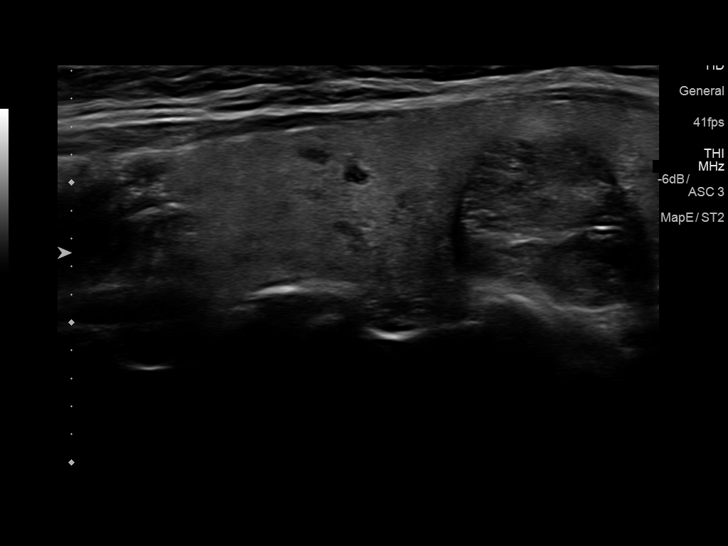
[im 76/83]
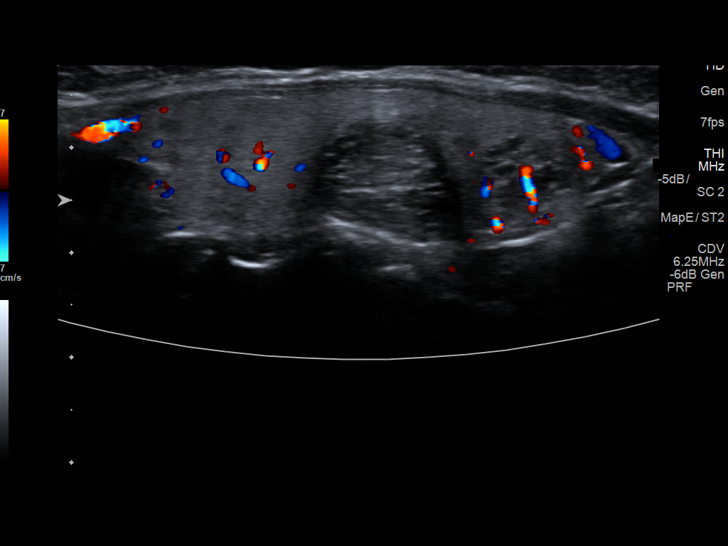
[im 83/83]
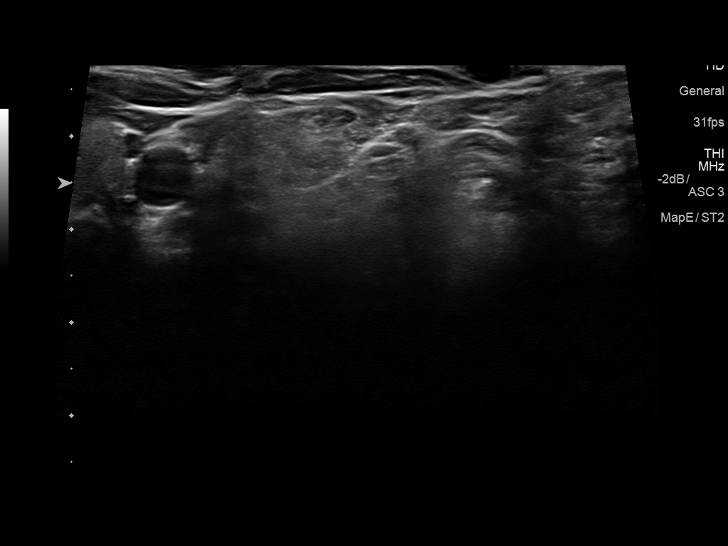

[13 of 25 positions shown; findings below may reference images not displayed]

FINDINGS: Parenchymal Echotexture: Mildly heterogenous

Isthmus: 0.3 cm thickness, stable

Right lobe: 5.4 x 1.2 x 1.8 cm, previously 5.6 x 1.1 x

Left lobe: 5.1 x 1.5 x 1.9 cm, previously 5.1 x 1.6 x

_________________________________________________________

Estimated total number of nodules >/= 1 cm: 2

Number of spongiform nodules >/=  2 cm not described below (TR1): 0

Number of mixed cystic and solid nodules >/= 1.5 cm not described
below (TR2): 0

_________________________________________________________

Nodule # :

Location: ;

Maximum size:  cm; Other 2 dimensions:  cm

Composition:

Echogenicity:

Shape:

Margins:

Echogenic foci:

ACR TI-RADS total points: .

ACR TI-RADS risk category: .

ACR TI-RADS recommendations:

_________________________________________________________

Nodule # 1:

Prior biopsy: No

Location: Left; Inferior

Maximum size: 1.1 cm; Other 2 dimensions: 0.8 x 1 cm, previously, 1
x 0.8 x 1 cm on 02/05/2011

Composition: solid/almost completely solid (2)

Echogenicity: isoechoic (1)

Shape: not taller-than-wide (0)

Margins: ill-defined (0)

Echogenic foci: none (0)

ACR TI-RADS total points: 3.

ACR TI-RADS risk category:  TR3 (3 points).

Significant change in size (>/= 20% in two dimensions and minimal
increase of 2 mm): No

Change in features: No

Change in ACR TI-RADS risk category: No

ACR TI-RADS recommendations:

Stability for greater than 5 years implies benignity. No follow-up
required.

_________________________________________________________

1.8 x 1.2 x 1.4 cm mid left nodule, previously 1.6 x 1.1 x 1.3; this
was previously biopsied.

0.9 cm hypoechoic nodule with microcalcification, mid right,
previously 0.8; does not meet criteria for biopsy or dedicated
imaging follow-up.

0.7 cm spongiform nodule, inferior right, previously 0.9; no
follow-up required.

0.6 cm hypoechoic nodule without calcifications, superior left,
previously 0.5; does not meet criteria for biopsy or dedicated
imaging follow-up.
IMPRESSION: 1. Mild thyromegaly with bilateral nodules. None meets current
criteria for biopsy or dedicated imaging follow-up.

The above is in keeping with the ACR TI-RADS recommendations - [HOSPITAL] 9807;[DATE].

## 2019-09-12 ENCOUNTER — Ambulatory Visit
Admission: RE | Admit: 2019-09-12 | Discharge: 2019-09-12 | Disposition: A | Discharge status: Home / Self Care | Payer: Medicare Other | Source: Ambulatory Visit | Attending: Endocrinology | Admitting: Endocrinology

## 2019-09-12 DIAGNOSIS — E049 Nontoxic goiter, unspecified: Secondary | ICD-10-CM

## 2020-07-11 ENCOUNTER — Other Ambulatory Visit: Payer: Self-pay | Admitting: Family Medicine

## 2020-07-11 DIAGNOSIS — Z1231 Encounter for screening mammogram for malignant neoplasm of breast: Secondary | ICD-10-CM

## 2020-09-09 ENCOUNTER — Ambulatory Visit: Payer: Medicare Other

## 2020-09-16 ENCOUNTER — Other Ambulatory Visit: Payer: Self-pay | Admitting: Endocrinology

## 2020-09-16 DIAGNOSIS — E049 Nontoxic goiter, unspecified: Secondary | ICD-10-CM

## 2020-10-07 ENCOUNTER — Ambulatory Visit
Admission: RE | Admit: 2020-10-07 | Discharge: 2020-10-07 | Disposition: A | Discharge status: Home / Self Care | Payer: Medicare Other | Source: Ambulatory Visit | Attending: Endocrinology | Admitting: Endocrinology

## 2020-10-07 DIAGNOSIS — E049 Nontoxic goiter, unspecified: Secondary | ICD-10-CM

## 2020-10-15 ENCOUNTER — Ambulatory Visit
Admission: RE | Admit: 2020-10-15 | Discharge: 2020-10-15 | Disposition: A | Discharge status: Home / Self Care | Payer: Medicare Other | Source: Ambulatory Visit | Attending: Family Medicine | Admitting: Family Medicine

## 2020-10-15 ENCOUNTER — Other Ambulatory Visit: Payer: Self-pay

## 2020-10-15 DIAGNOSIS — Z1231 Encounter for screening mammogram for malignant neoplasm of breast: Secondary | ICD-10-CM

## 2020-10-16 ENCOUNTER — Encounter: Payer: Self-pay | Admitting: Physician Assistant

## 2020-10-16 ENCOUNTER — Ambulatory Visit (INDEPENDENT_AMBULATORY_CARE_PROVIDER_SITE_OTHER): Payer: Medicare Other | Admitting: Physician Assistant

## 2020-10-16 DIAGNOSIS — L570 Actinic keratosis: Secondary | ICD-10-CM

## 2020-10-16 DIAGNOSIS — Z1283 Encounter for screening for malignant neoplasm of skin: Secondary | ICD-10-CM

## 2020-10-16 DIAGNOSIS — Z85828 Personal history of other malignant neoplasm of skin: Secondary | ICD-10-CM | POA: Diagnosis not present

## 2020-10-16 MED ORDER — ALCLOMETASONE DIPROPIONATE 0.05 % EX CREA: TOPICAL_CREAM | Freq: Every day | CUTANEOUS | 1 refills | Status: DC

## 2020-10-16 NOTE — Progress Notes (Signed)
   Follow-Up Visit   Subjective  Bailey Burns is a 75 y.o. female who presents for the following: Annual Exam (White area on the nose mohs procedure, patient has history of bcc and scc). She has some pink spots on her nose that have been there about 4-6 weeks. No symptoms.    The following portions of the chart were reviewed this encounter and updated as appropriate:  Tobacco  Allergies  Meds  Problems  Med Hx  Surg Hx  Fam Hx      Objective  Well appearing patient in no apparent distress; mood and affect are within normal limits.  A full examination was performed including scalp, head, eyes, ears, nose, lips, neck, chest, axillae, abdomen, back, buttocks, bilateral upper extremities, bilateral lower extremities, hands, feet, fingers, toes, fingernails, and toenails. All findings within normal limits unless otherwise noted below.  Left Alar Crease Dyspigmented scars are all clear.  Left Upper Back Erythematous patches with gritty scale.   Assessment & Plan  History of basal cell carcinoma (BCC) Left Alar Crease  Recent sunscreen use on the nose 2 pink lesions possible inflammation follow up 3 months for the nose.   alclomethasone (ACLOVATE) 0.05 % cream - Left Alar Crease Apply topically daily.  AK (actinic keratosis) Left Upper Back  Destruction of lesion - Left Upper Back Complexity: simple   Destruction method: cryotherapy   Informed consent: discussed and consent obtained   Timeout:  patient name, date of birth, surgical site, and procedure verified Lesion destroyed using liquid nitrogen: Yes   Cryotherapy cycles:  1 Outcome: patient tolerated procedure well with no complications   Post-procedure details: wound care instructions given      I, Jama Krichbaum, PA-C, have reviewed all documentation's for this visit.  The documentation on 10/16/20 for the exam, diagnosis, procedures and orders are all accurate and complete.

## 2020-12-18 ENCOUNTER — Other Ambulatory Visit: Payer: Self-pay | Admitting: Family Medicine

## 2020-12-18 DIAGNOSIS — M8580 Other specified disorders of bone density and structure, unspecified site: Secondary | ICD-10-CM

## 2021-01-07 ENCOUNTER — Ambulatory Visit
Admission: RE | Admit: 2021-01-07 | Discharge: 2021-01-07 | Disposition: A | Discharge status: Home / Self Care | Payer: Medicare Other | Source: Ambulatory Visit | Attending: Internal Medicine | Admitting: Internal Medicine

## 2021-01-07 ENCOUNTER — Other Ambulatory Visit: Payer: Self-pay

## 2021-01-07 DIAGNOSIS — M8580 Other specified disorders of bone density and structure, unspecified site: Secondary | ICD-10-CM

## 2021-02-19 ENCOUNTER — Ambulatory Visit: Payer: Medicare Other | Admitting: Physician Assistant

## 2021-05-21 IMAGING — US US THYROID
2 series · 13 of 25 positions shown · non-contrast
Comparison: 05/04/2017 and previous

CLINICAL DATA: Nontoxic goiter. Previous FNA biopsy left nodule
11/15/2013, mid left nodule 12/20/2006

EXAM:
THYROID ULTRASOUND
TECHNIQUE: Ultrasound examination of the thyroid gland and adjacent soft
tissues was performed.

[Series 1: us thyroid · 0.07mm/px · 12 of 57 slices shown (1 of 2)]
[im 1/57]
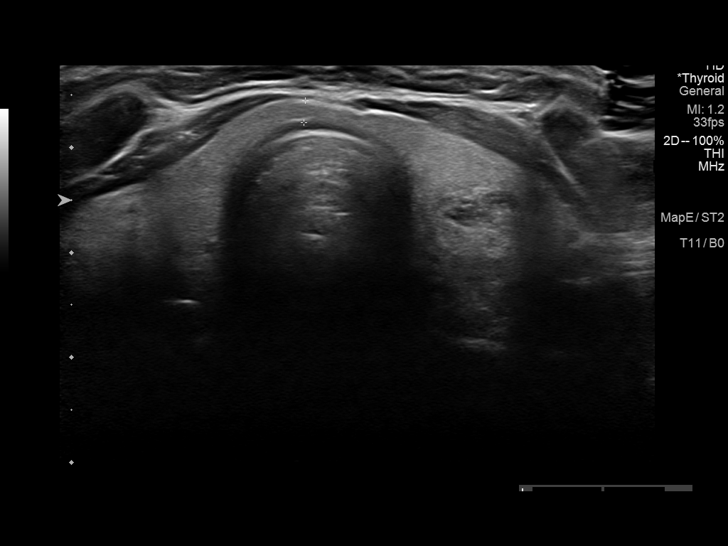
[im 5/57]
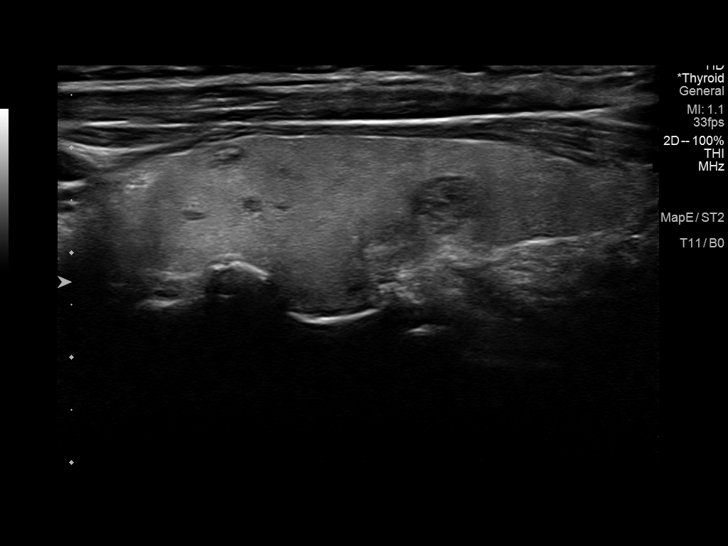
[im 10/57]
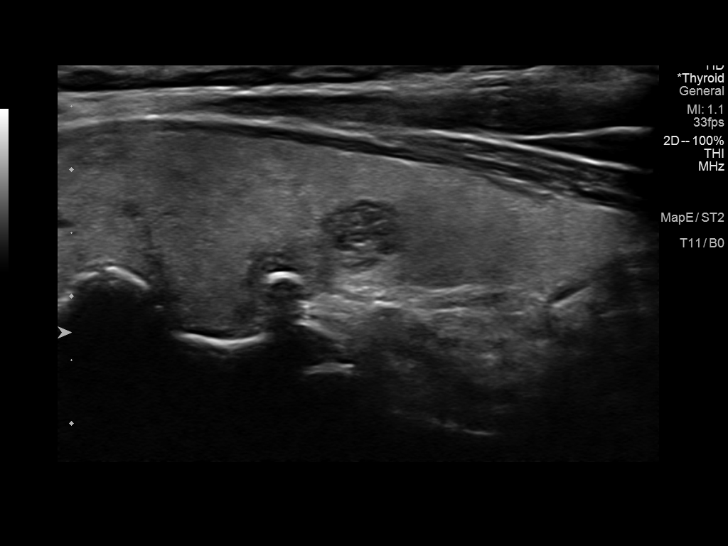
[im 15/57]
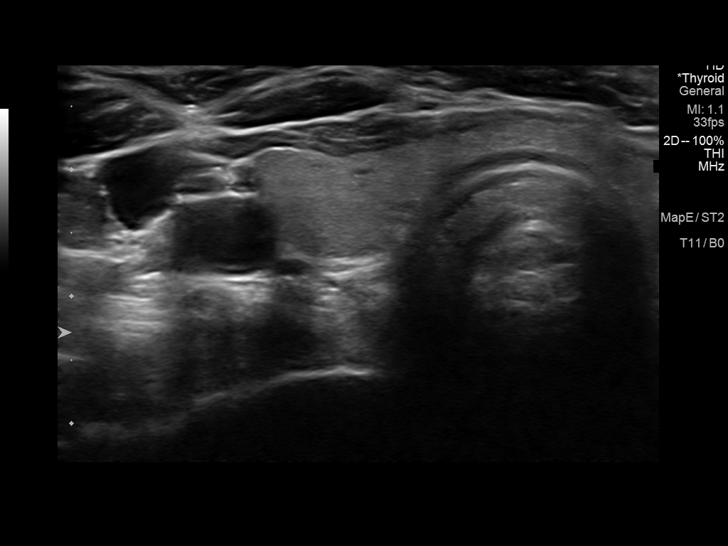
[im 20/57]
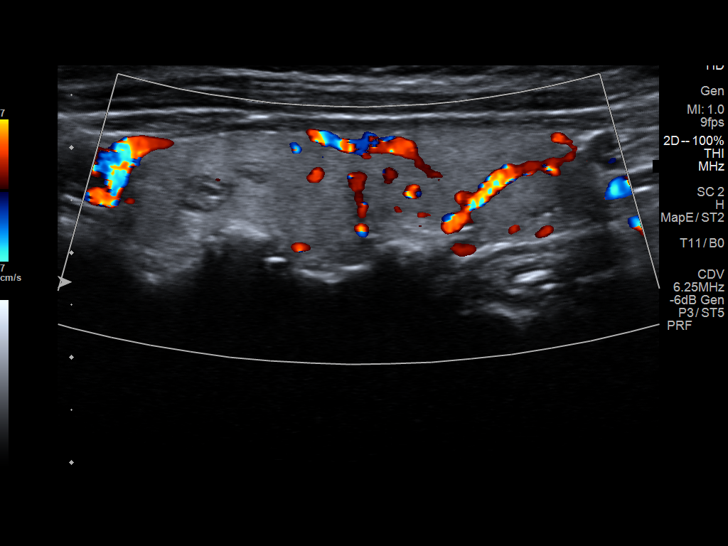
[im 25/57]
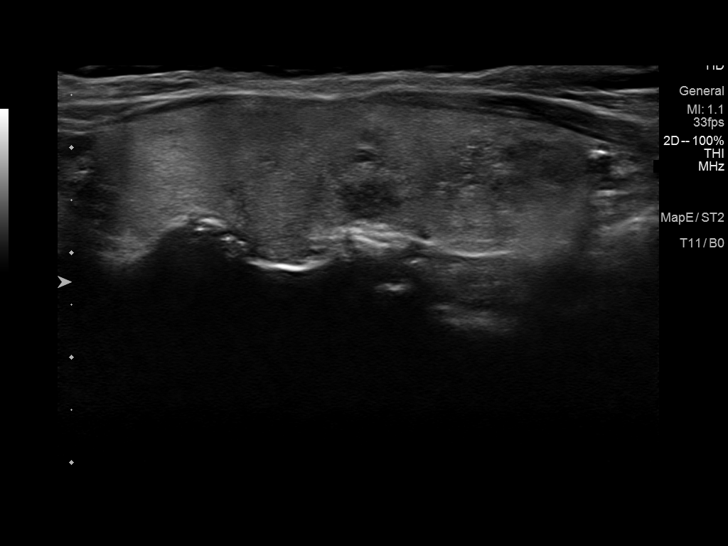
[im 30/57]
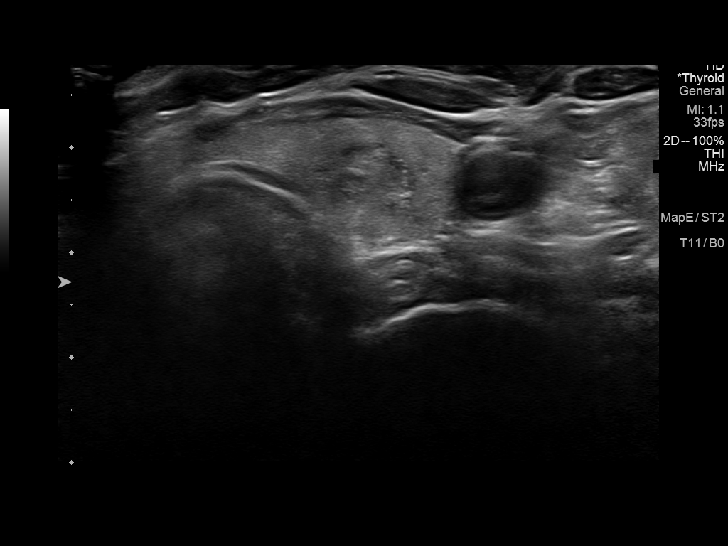
[im 35/57]
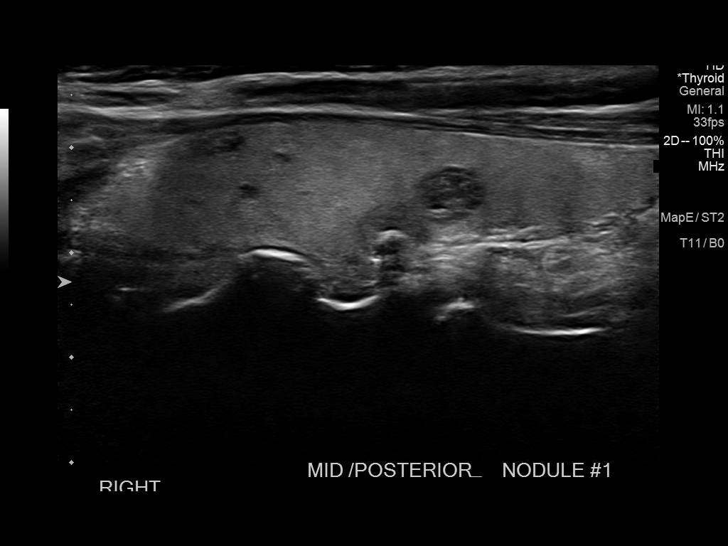
[im 39/57]
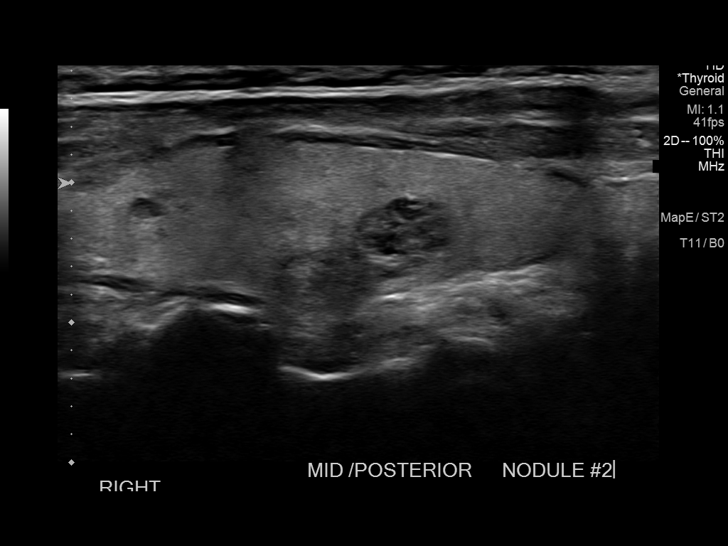
[im 44/57]
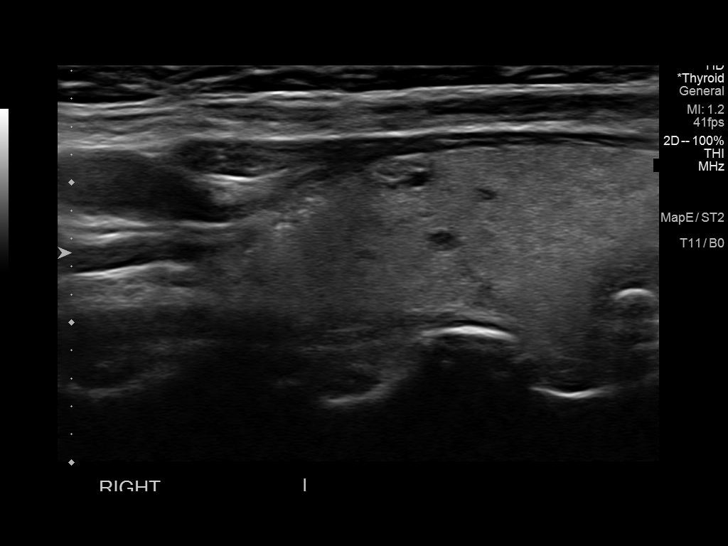
[im 49/57]
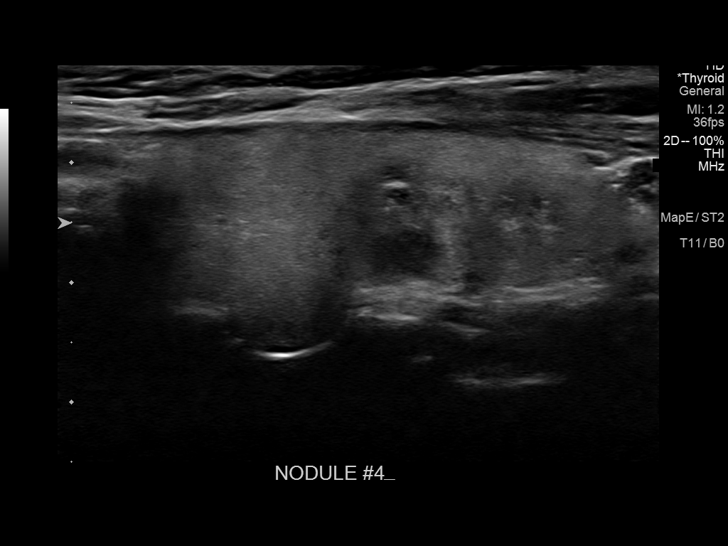
[im 54/57]
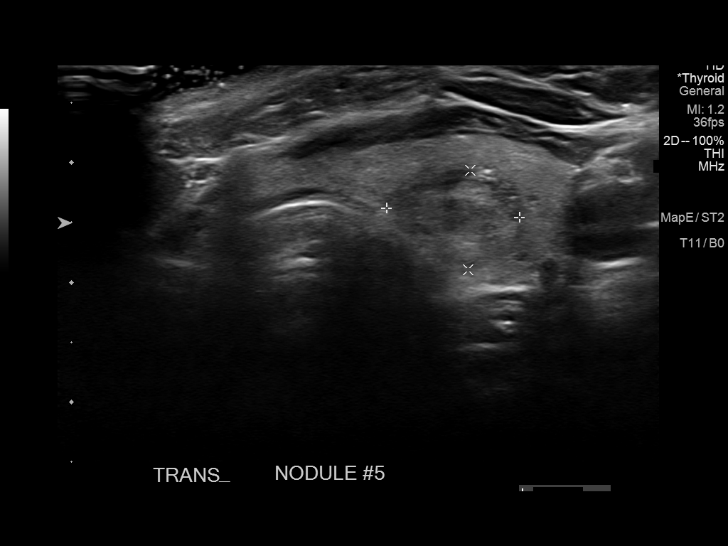

[Series 2001: us thyroid · 0.07mm/px · 1 of 2 slices shown (2 of 2)]
[im 1/2]
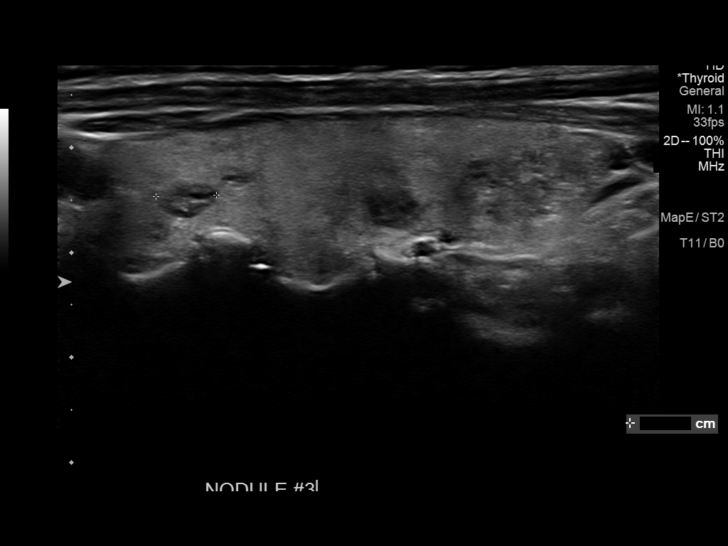

[13 of 25 positions shown; findings below may reference images not displayed]

FINDINGS: Parenchymal Echotexture: Mildly heterogenous

Isthmus: 0.2 cm thickness, previously

Right lobe: 5.2 x 1.7 x 1.6 cm, previously 5.4 x 1.2 x

Left lobe: 4.9 x 1.6 x 1.9 cm, previously 5.1 x 1.5 x

_________________________________________________________

Estimated total number of nodules >/= 1 cm: 2

Number of spongiform nodules >/=  2 cm not described below (TR1): 0

Number of mixed cystic and solid nodules >/= 1.5 cm not described
below (TR2): 0

_________________________________________________________

Nodule # 1: 0.8 cm nodule with macrocalcification, mid posterior
right, previously 0.8 cm on 11/01/2013; stability for greater than 5
years implies benignity; This nodule does NOT meet TI-RADS criteria
for biopsy or dedicated follow-up.

Nodule # 2: 0.7 cm hypoechoic nodule, mid right, stable; This nodule
does NOT meet TI-RADS criteria for biopsy or dedicated follow-up.

Nodule # 3: 0.6 cm hypoechoic nodule, superior left, stable; This
nodule does NOT meet TI-RADS criteria for biopsy or dedicated
follow-up.

Nodule # 4: 1.2 cm complex mid left nodule, previously 1.8; this was
previously biopsied x2

Nodule # 5:

Prior biopsy: No

Location: Left; Inferior

Maximum size: 1.5 cm; Other 2 dimensions: 1.1 x 0.8 cm, previously,
1.1 x 1 x 0.8 cm

Composition: solid/almost completely solid (2)

Echogenicity: hypoechoic (2)

Shape: not taller-than-wide (0)

Margins: ill-defined (0)

Echogenic foci: none (0)

ACR TI-RADS total points: 4.

ACR TI-RADS risk category:  TR4 (4-6 points).

Significant change in size (>/= 20% in two dimensions and minimal
increase of 2 mm): No

Change in features: No

Change in ACR TI-RADS risk category: No

ACR TI-RADS recommendations:

**Given size (>/= 1.5 cm) and appearance, fine needle aspiration of
this moderately suspicious nodule should be considered based on
TI-RADS criteria.
IMPRESSION: 1. Thyromegaly with bilateral nodules.
2. The inferior left nodule  meets criteria for FNA biopsy.

The above is in keeping with the ACR TI-RADS recommendations - [HOSPITAL] 4463;[DATE].

## 2021-09-16 ENCOUNTER — Other Ambulatory Visit: Payer: Self-pay | Admitting: Internal Medicine

## 2021-09-16 DIAGNOSIS — Z1231 Encounter for screening mammogram for malignant neoplasm of breast: Secondary | ICD-10-CM

## 2021-10-03 IMAGING — US US THYROID
1 series · 13 of 25 positions shown · non-contrast
Comparison: 04/30/2019 and 11/01/2013

CLINICAL DATA: Goiter. Previous biopsy to left thyroid nodule in
6471 and left mid nodule in 7550.

EXAM:
THYROID ULTRASOUND
TECHNIQUE: Ultrasound examination of the thyroid gland and adjacent soft
tissues was performed.

[Series 1: us thyroid · 0.06mm/px · 13 of 81 slices shown]
[im 1/81]
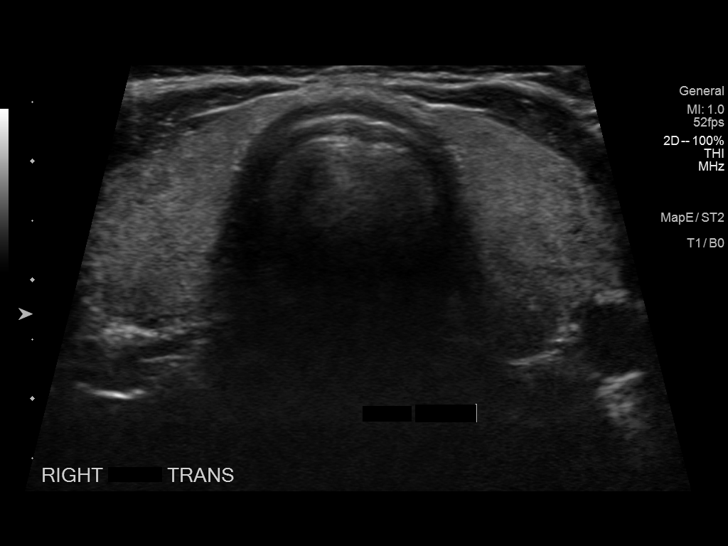
[im 7/81]
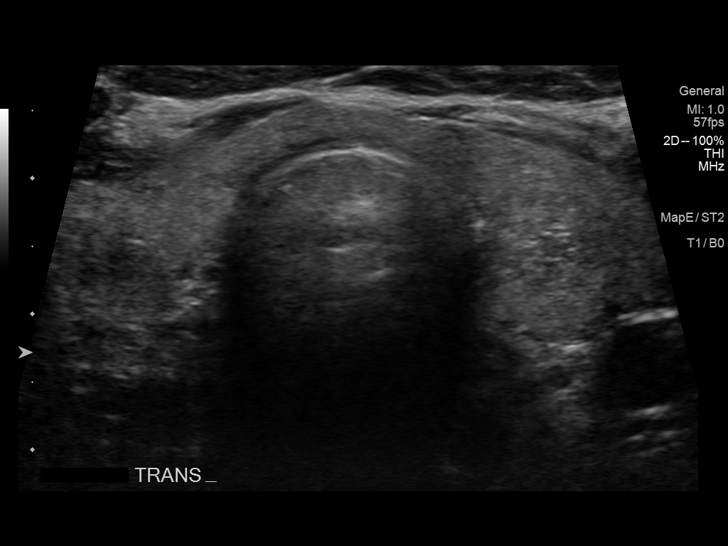
[im 14/81]
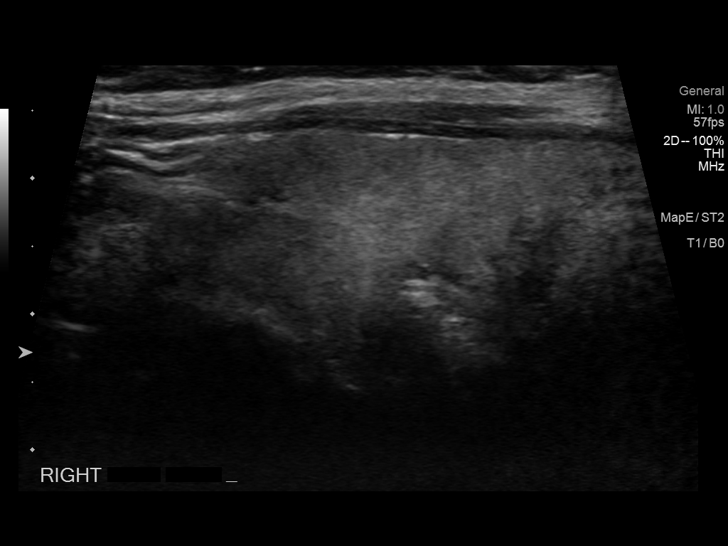
[im 21/81]
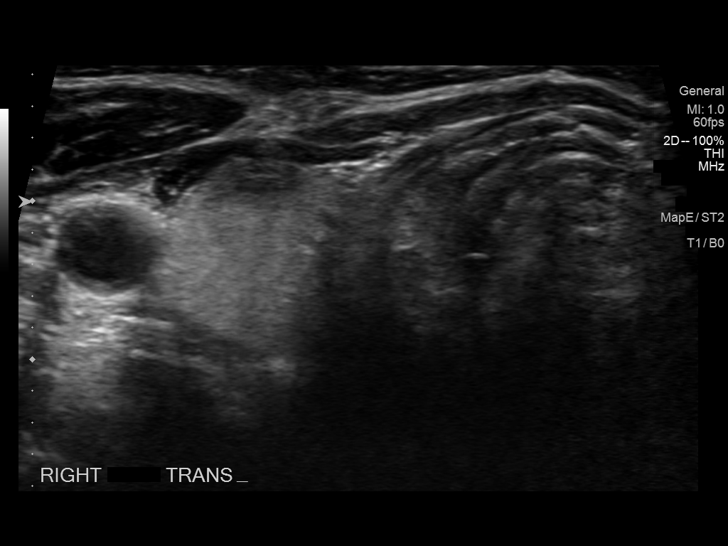
[im 27/81]
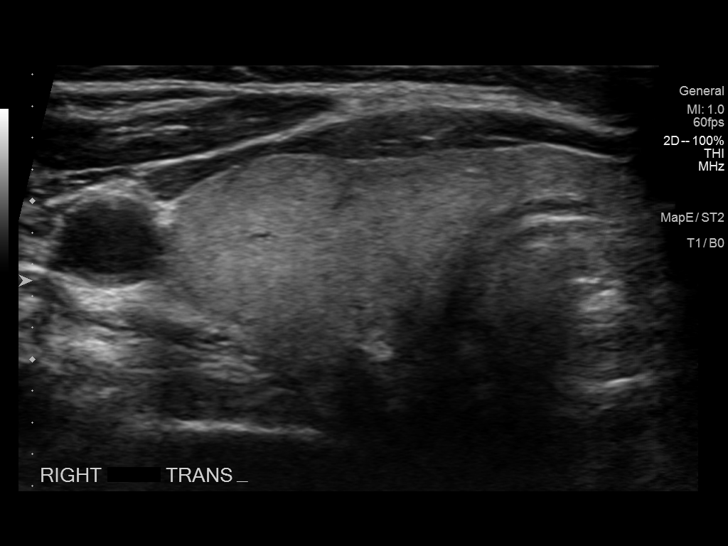
[im 34/81]
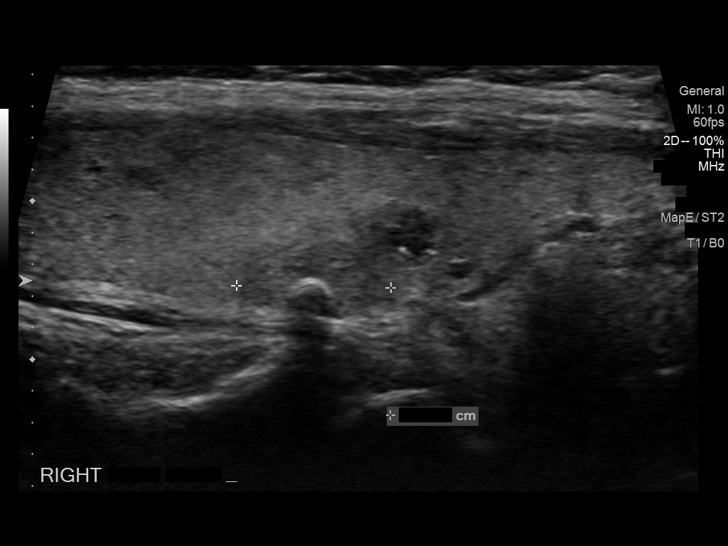
[im 41/81]
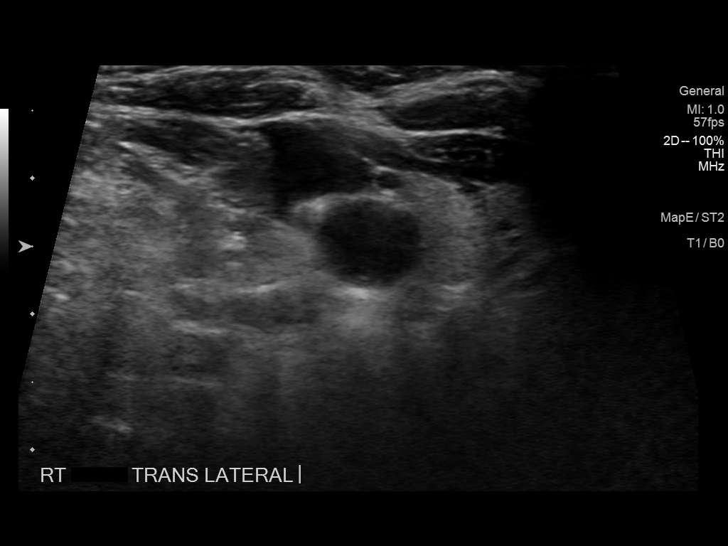
[im 47/81]
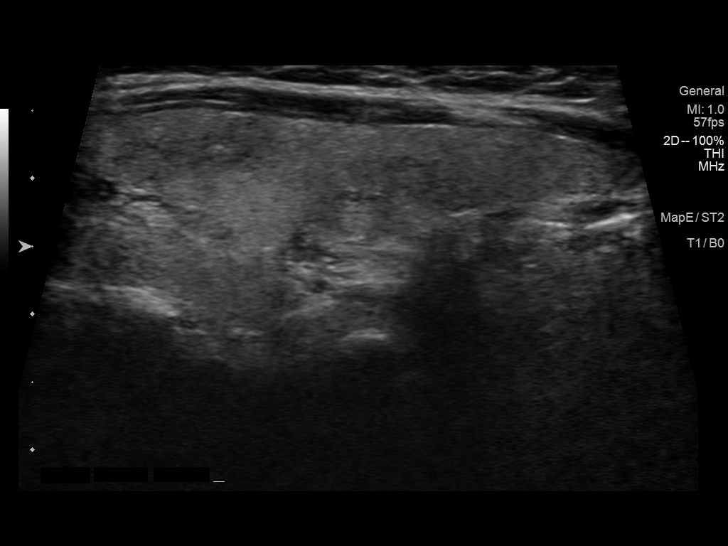
[im 54/81]
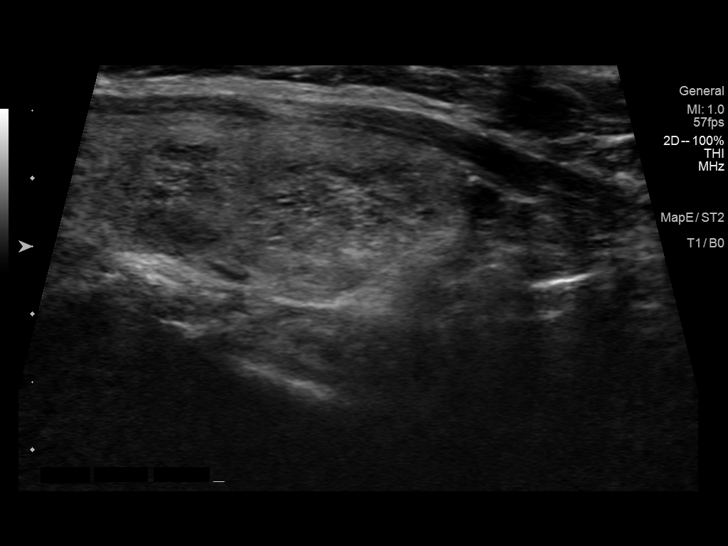
[im 61/81]
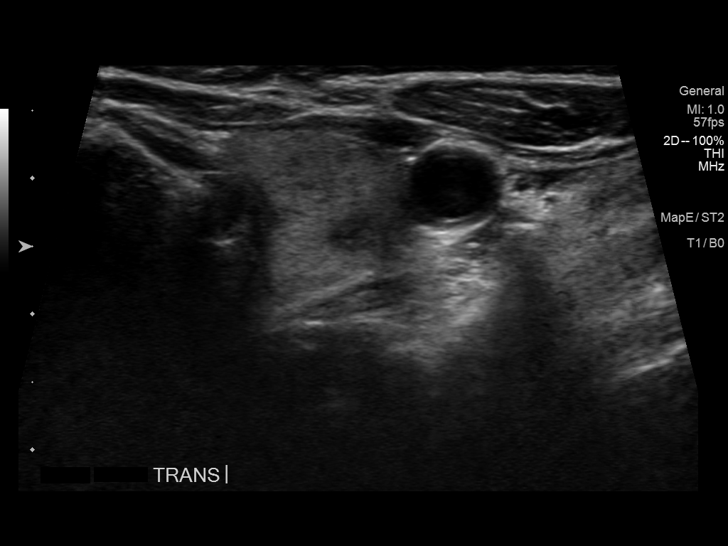
[im 67/81]
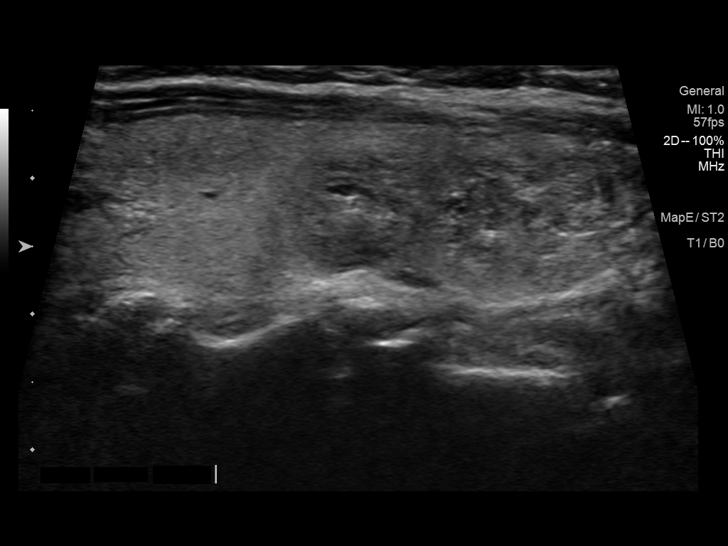
[im 74/81]
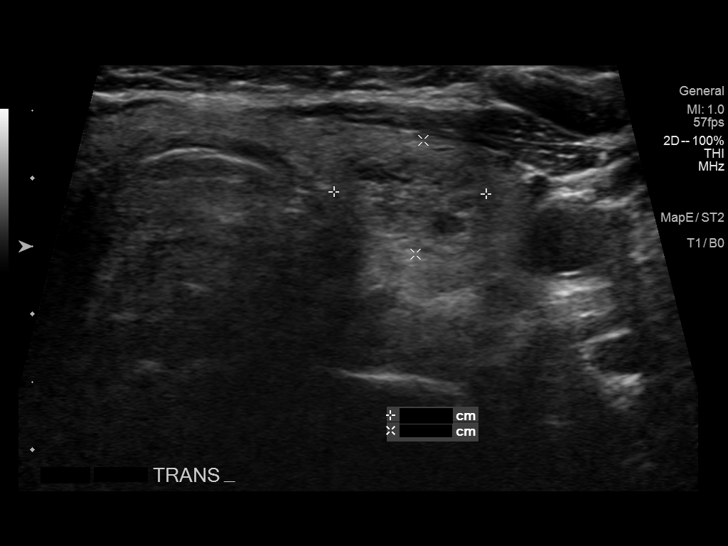
[im 81/81]
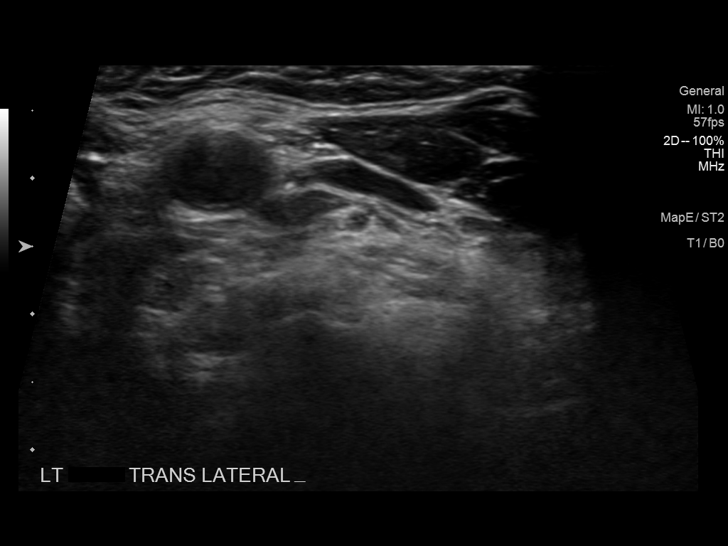

[13 of 25 positions shown; findings below may reference images not displayed]

FINDINGS: Parenchymal Echotexture: Moderately heterogenous

Isthmus: 0.3 cm, previously 0.2 cm

Right lobe: 5.4 x 1.8 x 1.9 cm, previously 5.2 x 1.7 x 1.6 cm

Left lobe: 5.4 x 1.3 x 1.8 cm, previously 4.9 x 1.6 x 1.9 cm

_________________________________________________________

Estimated total number of nodules >/= 1 cm: 3

Number of spongiform nodules >/=  2 cm not described below (TR1): 0

Number of mixed cystic and solid nodules >/= 1.5 cm not described
below (TR2): 0

_________________________________________________________

Nodule 1 in the right mid thyroid lobe is a mildly hypoechoic nodule
with a macrocalcification. This nodule measures 1.0 x 0.5 x 0.9 cm
and previously measured 0.7 x 0.4 x 0.8 cm. This nodule has
minimally changed since 6471. This is most compatible with a benign
nodule based on the stability.

Nodule 2 in the right inferior thyroid lobe appears to be a
spongiform nodule that measures 0.8 x 0.5 x 0.7 cm and recently
measured 0.7 x 0.5 x 0.7 cm. This nodule does not meet criteria for
biopsy or dedicated follow-up.

Nodule 3 in the left mid thyroid lobe is hypoechoic and measures up
to 0.6 cm and previously measured 0.6 cm.

Nodule 4 in the left mid thyroid lobe is a solid, mildly
heterogeneous nodule that measures 1.0 x 0.8 x 1.3 cm and previously
measured 1.2 x 0.9 x 1.2 cm. This nodule measured 1.3 x 1.1 x 1.5 cm
in 6471.

Nodule 5 is an isoechoic solid nodule in the left inferior thyroid
lobe that measures 1.4 x 0.8 x 1.1 cm and previously measured 1.5 x
0.8 x 1.1 cm. This nodule measured 1.2 x 0.8 x 1.0 cm in 6471.
IMPRESSION: 1. Multiple thyroid nodules.
2. Dominant nodules have minimally since 04/30/2019 and minimal
change since 6471. These are most compatible with benign nodules
based on the stability.

The above is in keeping with the ACR TI-RADS recommendations - [HOSPITAL] 0639;[DATE].

## 2021-10-19 ENCOUNTER — Ambulatory Visit
Admission: RE | Admit: 2021-10-19 | Discharge: 2021-10-19 | Disposition: A | Discharge status: Home / Self Care | Payer: Medicare Other | Source: Ambulatory Visit | Attending: Internal Medicine | Admitting: Internal Medicine

## 2021-10-19 DIAGNOSIS — Z1231 Encounter for screening mammogram for malignant neoplasm of breast: Secondary | ICD-10-CM

## 2022-03-30 ENCOUNTER — Ambulatory Visit: Payer: Medicare Other | Admitting: Cardiology

## 2022-03-30 ENCOUNTER — Encounter: Payer: Self-pay | Admitting: Cardiology

## 2022-03-30 VITALS — BP 144/98 | HR 69 | Ht 64.0 in | Wt 120.0 lb

## 2022-03-30 DIAGNOSIS — R002 Palpitations: Secondary | ICD-10-CM

## 2022-03-30 DIAGNOSIS — I4729 Other ventricular tachycardia: Secondary | ICD-10-CM | POA: Insufficient documentation

## 2022-03-30 DIAGNOSIS — I471 Supraventricular tachycardia, unspecified: Secondary | ICD-10-CM

## 2022-03-30 DIAGNOSIS — I493 Ventricular premature depolarization: Secondary | ICD-10-CM | POA: Insufficient documentation

## 2022-03-30 MED ORDER — METOPROLOL TARTRATE 25 MG PO TABS: 12.5000 mg | ORAL_TABLET | Freq: Two times a day (BID) | ORAL | 3 refills | Status: DC

## 2022-03-30 NOTE — Progress Notes (Signed)
Patient referred by Mckinley Jewel, MD for palpitations  Subjective:   Bailey Burns, female    DOB: 08/01/1945, 77 y.o.   MRN: [id number]   Chief Complaint  Patient presents with   Palpitations   New Patient (Initial Visit)    HPI  77 y.o. Caucasian female with hypothyroidism, palpitations  Patient is retired, stays active with gardening, senior hiking. Patient had palpitations 2-3 times nightly in November. I personally reviewed Zio patch tracings from that time, details below. Symptoms have improved since, now occur about 3 times  a week, usually at night, lasting for about 5 min. She does not drink much caffeine or alcohol. Symptoms have somewhat improved since reducing dose of levothyroxine.    Past Medical History:  Diagnosis Date   Basal cell carcinoma 08/11/2015   nodular- Left Jawline- clear   BCC (basal cell carcinoma of skin) 11/06/2018   Infundibulocystic-Left Bulb of Nose-MOHS   BCC (basal cell carcinoma of skin) 11/06/2018   Nodular-Left Nostril Junction-MOHS   Hypercholesterolemia    history of   Hypertension    Hypothyroidism    Squamous cell carcinoma of skin 03/27/2015   [facility] upper chest- c x 3,5FU     Past Surgical History:  Procedure Laterality Date   COLONOSCOPY WITH PROPOFOL N/A 04/23/2014   Procedure: COLONOSCOPY WITH PROPOFOL;  Surgeon: Garlan Fair, MD;  Location: [facility];  Service: Endoscopy;  Laterality: N/A;   DIAGNOSTIC LAPAROSCOPY     ruled out endometriosis   DILATION AND CURETTAGE OF UTERUS     TONSILLECTOMY       Social History   Tobacco Use  Smoking Status Never  Smokeless Tobacco Never    Social History   Substance and Sexual Activity  Alcohol Use Yes   Comment: rare occ.     Family History  Problem Relation Age of Onset   Cancer Mother       Current Outpatient Medications:    atorvastatin (LIPITOR) 10 MG tablet, Take 10 mg by mouth daily. , Disp: , Rfl: 3   Calcium Carbonate-Vitamin D  (CALCIUM 600 +D HIGH POTENCY PO), Take by mouth., Disp: , Rfl:    citalopram (CELEXA) 20 MG tablet, Take 20 mg by mouth daily., Disp: , Rfl:    estrogens, conjugated, (PREMARIN) 0.625 MG tablet, Take 0.625 mg by mouth daily. Take daily for 21 days then do not take for 7 days., Disp: , Rfl:    levothyroxine (SYNTHROID) 25 MCG tablet, Take 12.5 mcg by mouth daily before breakfast., Disp: , Rfl:    levothyroxine (SYNTHROID, LEVOTHROID) 200 MCG tablet, Take 200 mcg by mouth daily before breakfast., Disp: , Rfl:    Multiple Vitamin (MULTIVITAMIN WITH MINERALS) TABS tablet, Take 1 tablet by mouth daily., Disp: , Rfl:    valsartan-hydrochlorothiazide (DIOVAN-HCT) 160-12.5 MG per tablet, Take 1 tablet by mouth every evening. 04-11-14 recently placed on Valsartan without Hydrochlorthiazide, Disp: , Rfl:    Cardiovascular and other pertinent studies:  Reviewed external labs and tests, independently interpreted  EKG 03/30/2022: Sinus rhythm 82 bpm Frequent multiform ectopic ventricular beats   Mobile cardiac telemetry 13 days 02/11/2022 - 02/24/2022: Dominant rhythm: Sinus. HR 59-157 bpm. Avg HR 81 bpm, in sinus rhythm. 108 episodes of SVT/atrial tachycardia, fastest at 188 bpm for 9 beats, longest for 13.8 secs at 141 bpm. <1% isolated SVE, couplet/triplets. 13 episodes of VT, fastest at 169 bpm for 5 beats, longest for 8 beats at 106 bpm. 13.2% isolated VE, 1.2 %  couplets, 1% triplets. No atrial fibrillation/atrial flutter/high grade AV block, sinus pause >3sec noted. 43 patient triggered events, correlated with VE     Recent labs: 11/24/2021: Glucose 87, BUN/Cr 14/0.7. EGFR 86. K 4.3. Hb 14.3. HbA1C NA Chol 179, TG 161, HDL 49, LDL 102 TSH 1.0 normal   Review of Systems  Cardiovascular:  Positive for palpitations. Negative for chest pain, dyspnea on exertion, leg swelling and syncope.         Vitals:   03/30/22 1344 03/30/22 1357  BP: (!) 160/85 (!) 144/98  Pulse: 70 69  SpO2: 97%  98%     Body mass index is 20.6 kg/m. Filed Weights   03/30/22 1344  Weight: 120 lb (54.4 kg)     Objective:   Physical Exam Vitals and nursing note reviewed.  Constitutional:      General: She is not in acute distress. Neck:     Vascular: No JVD.  Cardiovascular:     Rate and Rhythm: Normal rate and regular rhythm.     Heart sounds: Normal heart sounds. No murmur heard. Pulmonary:     Effort: Pulmonary effort is normal.     Breath sounds: Normal breath sounds. No wheezing or rales.  Musculoskeletal:     Right lower leg: No edema.     Left lower leg: No edema.         Visit diagnoses:   ICD-10-CM   1. Palpitations  R00.2 EKG 12-Lead    2. 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 PSVT (paroxysmal supraventricular tachycardia)  I47.10 PCV ECHOCARDIOGRAM COMPLETE    4. NSVT (nonsustained ventricular tachycardia) (HCC)  I47.29 PCV ECHOCARDIOGRAM COMPLETE       Orders Placed This Encounter  Procedures   EKG 12-Lead   PCV ECHOCARDIOGRAM COMPLETE     Meds ordered this encounter  Medications   metoprolol tartrate (LOPRESSOR) 25 MG tablet    Sig: Take 0.5 tablets (12.5 mg total) by mouth 2 (two) times daily.    Dispense:  90 tablet    Refill:  3     Assessment & Recommendations:   77 y.o. Caucasian female with hypothyroidism, symptomatic PVC, PSVT, NSVT  Symptoms are currently improved, but not resolved. Recommend echocardiogram, metoprolol tartrate 12.5 mg bid. F/u in 3 months. If symptoms not improved, will then repeat 2 week cardiac telemetry.  Blood pressure generally lower than today. Continue f/u w/PCP.   Thank you for referring the patient to [place]. Please feel free to contact with any questions.   Nigel Mormon, MD Pager: [phone] Office: [phone]

## 2022-03-31 ENCOUNTER — Other Ambulatory Visit: Payer: Self-pay

## 2022-03-31 ENCOUNTER — Encounter: Payer: Self-pay | Admitting: Cardiology

## 2022-03-31 MED ORDER — METOPROLOL TARTRATE 25 MG PO TABS: 12.5000 mg | ORAL_TABLET | Freq: Two times a day (BID) | ORAL | 0 refills | Status: DC

## 2022-04-28 ENCOUNTER — Ambulatory Visit: Payer: Medicare Other

## 2022-04-28 DIAGNOSIS — I493 Ventricular premature depolarization: Secondary | ICD-10-CM

## 2022-04-28 DIAGNOSIS — I4729 Other ventricular tachycardia: Secondary | ICD-10-CM

## 2022-04-28 DIAGNOSIS — I471 Supraventricular tachycardia, unspecified: Secondary | ICD-10-CM

## 2022-05-05 ENCOUNTER — Telehealth: Payer: Self-pay

## 2022-05-05 NOTE — Telephone Encounter (Signed)
Patient calling for echo results 

## 2022-05-05 NOTE — Telephone Encounter (Signed)
Patient aware.

## 2022-06-28 ENCOUNTER — Ambulatory Visit: Payer: Medicare Other | Admitting: Cardiology

## 2022-07-02 ENCOUNTER — Ambulatory Visit: Payer: Medicare Other | Admitting: Cardiology

## 2022-07-02 ENCOUNTER — Encounter: Payer: Self-pay | Admitting: Cardiology

## 2022-07-02 VITALS — BP 143/85 | HR 58 | Resp 16 | Ht 64.0 in | Wt 120.0 lb

## 2022-07-02 DIAGNOSIS — I493 Ventricular premature depolarization: Secondary | ICD-10-CM

## 2022-07-02 DIAGNOSIS — I471 Supraventricular tachycardia, unspecified: Secondary | ICD-10-CM

## 2022-07-02 DIAGNOSIS — I4729 Other ventricular tachycardia: Secondary | ICD-10-CM

## 2022-07-02 MED ORDER — METOPROLOL TARTRATE 25 MG PO TABS: 12.5000 mg | ORAL_TABLET | Freq: Two times a day (BID) | ORAL | 3 refills | Status: DC

## 2022-07-02 NOTE — Progress Notes (Signed)
Patient referred by Ollen Bowl, MD for palpitations  Subjective:   Bailey Burns, female    DOB: 08-19-1945, 77 y.o.   MRN: [id number]   Chief Complaint  Patient presents with   Palpitations   Follow-up    HPI  77 y.o. Caucasian female with hypothyroidism, PSVT, NSVT  Patient symptoms are very well-controlled metoprolol to 12.5 mg twice daily.  Blood pressure slightly elevated today, but tells me that has been just under 140 mmHg on regular home monitoring.  Initial consultation visit 03/2022: Patient is retired, stays active with gardening, senior hiking. Patient had palpitations 2-3 times nightly in November. I personally reviewed Zio patch tracings from that time, details below. Symptoms have improved since, now occur about 3 times  a week, usually at night, lasting for about 5 min. She does not drink much caffeine or alcohol. Symptoms have somewhat improved since reducing dose of levothyroxine.    Current Outpatient Medications:    atorvastatin (LIPITOR) 10 MG tablet, Take 10 mg by mouth daily. , Disp: , Rfl: 3   Calcium Carbonate-Vitamin D (CALCIUM 600 +D HIGH POTENCY PO), Take by mouth., Disp: , Rfl:    citalopram (CELEXA) 20 MG tablet, Take 20 mg by mouth daily., Disp: , Rfl:    estrogens, conjugated, (PREMARIN) 0.625 MG tablet, Take 0.625 mg by mouth daily. Take daily for 21 days then do not take for 7 days., Disp: , Rfl:    levothyroxine (SYNTHROID) 25 MCG tablet, Take 12.5 mcg by mouth daily before breakfast., Disp: , Rfl:    levothyroxine (SYNTHROID, LEVOTHROID) 200 MCG tablet, Take 200 mcg by mouth daily before breakfast., Disp: , Rfl:    metoprolol tartrate (LOPRESSOR) 25 MG tablet, Take 0.5 tablets (12.5 mg total) by mouth 2 (two) times daily., Disp: 7 tablet, Rfl: 0   Multiple Vitamin (MULTIVITAMIN WITH MINERALS) TABS tablet, Take 1 tablet by mouth daily., Disp: , Rfl:    valsartan-hydrochlorothiazide (DIOVAN-HCT) 160-12.5 MG per tablet, Take 1 tablet by  mouth every evening. 04-11-14 recently placed on Valsartan without Hydrochlorthiazide, Disp: , Rfl:    Cardiovascular and other pertinent studies:  Reviewed external labs and tests, independently interpreted  EKG 03/30/2022: Sinus rhythm 82 bpm Frequent multiform ectopic ventricular beats   Echocardiogram 04/28/2022: Normal LV systolic function with visual EF 60-65%. Left ventricle cavity is normal in size. Normal left ventricular wall thickness. Sigmoid septum. Normal global wall motion. Normal diastolic filling pattern, normal LAP. Calculated EF 61%. Structurally normal mitral valve.  Mild (Grade I) mitral regurgitation. Structurally normal tricuspid valve with trace regurgitation. No evidence of pulmonary hypertension. No prior available for comparison.   Mobile cardiac telemetry 13 days 02/11/2022 - 02/24/2022: Dominant rhythm: Sinus. HR 59-157 bpm. Avg HR 81 bpm, in sinus rhythm. 108 episodes of SVT/atrial tachycardia, fastest at 188 bpm for 9 beats, longest for 13.8 secs at 141 bpm. <1% isolated SVE, couplet/triplets. 13 episodes of VT, fastest at 169 bpm for 5 beats, longest for 8 beats at 106 bpm. 13.2% isolated VE, 1.2 % couplets, 1% triplets. No atrial fibrillation/atrial flutter/high grade AV block, sinus pause >3sec noted. 43 patient triggered events, correlated with VE     Recent labs: 11/24/2021: Glucose 87, BUN/Cr 14/0.7. EGFR 86. K 4.3. Hb 14.3. HbA1C NA Chol 179, TG 161, HDL 49, LDL 102 TSH 1.0 normal   Review of Systems  Cardiovascular:  Positive for palpitations. Negative for chest pain, dyspnea on exertion, leg swelling and syncope.  There were no vitals filed for this visit.    There is no height or weight on file to calculate BMI. There were no vitals filed for this visit.    Objective:   Physical Exam Vitals and nursing note reviewed.  Constitutional:      General: She is not in acute distress. Neck:     Vascular: No JVD.   Cardiovascular:     Rate and Rhythm: Normal rate and regular rhythm.     Heart sounds: Normal heart sounds. No murmur heard. Pulmonary:     Effort: Pulmonary effort is normal.     Breath sounds: Normal breath sounds. No wheezing or rales.  Musculoskeletal:     Right lower leg: No edema.     Left lower leg: No edema.         Visit diagnoses:   ICD-10-CM   1. Symptomatic PVCs  I49.3     2. PSVT (paroxysmal supraventricular tachycardia)  I47.10     3. NSVT (nonsustained ventricular tachycardia) (HCC)  I47.29        Meds ordered this encounter  Medications   metoprolol tartrate (LOPRESSOR) 25 MG tablet    Sig: Take 0.5 tablets (12.5 mg total) by mouth 2 (two) times daily.    Dispense:  90 tablet    Refill:  3     Assessment & Recommendations:   77 y.o. Caucasian female with hypothyroidism, symptomatic PVC, PSVT, NSVT  Symptoms very well-controlled metoprolol tartrate 12.5 mg twice daily.  Continue the same.  Blood pressure generally well-controlled, no changes made today.  F/u in 1 year   Elder Negus, MD Pager: [phone] Office: [phone]

## 2022-08-27 ENCOUNTER — Other Ambulatory Visit: Payer: Self-pay | Admitting: Endocrinology

## 2022-08-27 DIAGNOSIS — E049 Nontoxic goiter, unspecified: Secondary | ICD-10-CM

## 2022-08-31 ENCOUNTER — Other Ambulatory Visit: Payer: Self-pay | Admitting: Internal Medicine

## 2022-08-31 DIAGNOSIS — Z1231 Encounter for screening mammogram for malignant neoplasm of breast: Secondary | ICD-10-CM

## 2022-09-09 ENCOUNTER — Ambulatory Visit
Admission: RE | Admit: 2022-09-09 | Discharge: 2022-09-09 | Disposition: A | Discharge status: Home / Self Care | Payer: Medicare Other | Source: Ambulatory Visit | Attending: Endocrinology | Admitting: Endocrinology

## 2022-09-09 DIAGNOSIS — E049 Nontoxic goiter, unspecified: Secondary | ICD-10-CM

## 2022-10-21 ENCOUNTER — Ambulatory Visit
Admission: RE | Admit: 2022-10-21 | Discharge: 2022-10-21 | Disposition: A | Discharge status: Home / Self Care | Payer: Medicare Other | Source: Ambulatory Visit | Attending: Internal Medicine | Admitting: Internal Medicine

## 2022-10-21 DIAGNOSIS — Z1231 Encounter for screening mammogram for malignant neoplasm of breast: Secondary | ICD-10-CM

## 2023-05-08 ENCOUNTER — Other Ambulatory Visit: Payer: Self-pay | Admitting: Cardiology

## 2023-05-20 ENCOUNTER — Telehealth: Payer: Self-pay | Admitting: Cardiology

## 2023-05-20 ENCOUNTER — Other Ambulatory Visit: Payer: Self-pay | Admitting: Cardiology

## 2023-05-20 MED ORDER — METOPROLOL TARTRATE 25 MG PO TABS: 12.5000 mg | ORAL_TABLET | Freq: Two times a day (BID) | ORAL | 1 refills | Status: DC

## 2023-05-20 NOTE — Telephone Encounter (Signed)
*  STAT* If patient is at the pharmacy, call can be transferred to refill team.   1. Which medications need to be refilled? (please list name of each medication and dose if known)   metoprolol  tartrate (LOPRESSOR ) 25 MG tablet    2. Which pharmacy/location (including street and city if local pharmacy) is medication to be sent to? [id number] - [place], [place] - [address] AT [facility] OF [address]   3. Do they need a 30 day or 90 day supply? 30  Patient has appt on 6/23

## 2023-05-20 NOTE — Telephone Encounter (Signed)
 Pt's medication was sent to pt's pharmacy as requested. Confirmation received.

## 2023-06-27 ENCOUNTER — Other Ambulatory Visit: Payer: Self-pay

## 2023-06-27 ENCOUNTER — Ambulatory Visit

## 2023-06-27 ENCOUNTER — Encounter: Payer: Self-pay | Admitting: Cardiology

## 2023-06-27 ENCOUNTER — Ambulatory Visit: Attending: Cardiology | Admitting: Cardiology

## 2023-06-27 VITALS — BP 138/72 | HR 66 | Ht 65.0 in | Wt 121.6 lb

## 2023-06-27 DIAGNOSIS — I493 Ventricular premature depolarization: Secondary | ICD-10-CM

## 2023-06-27 DIAGNOSIS — I4729 Other ventricular tachycardia: Secondary | ICD-10-CM | POA: Diagnosis not present

## 2023-06-27 DIAGNOSIS — I471 Supraventricular tachycardia, unspecified: Secondary | ICD-10-CM

## 2023-06-27 DIAGNOSIS — I10 Essential (primary) hypertension: Secondary | ICD-10-CM | POA: Insufficient documentation

## 2023-06-27 DIAGNOSIS — E782 Mixed hyperlipidemia: Secondary | ICD-10-CM | POA: Diagnosis not present

## 2023-06-27 MED ORDER — ATORVASTATIN CALCIUM 40 MG PO TABS: 40.0000 mg | ORAL_TABLET | Freq: Every day | ORAL | 3 refills | Status: DC

## 2023-06-27 MED ORDER — METOPROLOL TARTRATE 25 MG PO TABS: 12.5000 mg | ORAL_TABLET | Freq: Two times a day (BID) | ORAL | 3 refills | Status: AC

## 2023-06-27 MED ORDER — ATORVASTATIN CALCIUM 40 MG PO TABS: 40.0000 mg | ORAL_TABLET | Freq: Every day | ORAL | 3 refills | Status: AC

## 2023-06-27 NOTE — Progress Notes (Unsigned)
 Enrolled for [employer] to mail a ZIO XT long term holter monitor to the patients address on file.

## 2023-06-27 NOTE — Patient Instructions (Addendum)
 Medication Instructions:  Your physician has recommended you make the following change in your medication:   1-INCREASE atorvastatin 40 mg by mouth daily.  *If you need a refill on your cardiac medications before your next appointment, please call your pharmacy*  Lab Work: If you have labs (blood work) drawn today and your tests are completely normal, you will receive your results only by: MyChart Message (if you have MyChart) OR A paper copy in the mail If you have any lab test that is abnormal or we need to change your treatment, we will call you to review the results.  Testing/Procedures: Your physician has requested that you have an echocardiogram. Echocardiography is a painless test that uses sound waves to create images of your heart. It provides your doctor with information about the size and shape of your heart and how well your heart's chambers and valves are working. This procedure takes approximately one hour. There are no restrictions for this procedure. Please do NOT wear cologne, perfume, aftershave, or lotions (deodorant is allowed). Please arrive 15 minutes prior to your appointment time.  Please note: We ask at that you not bring children with you during ultrasound (echo/ vascular) testing. Due to room size and safety concerns, children are not allowed in the ultrasound rooms during exams. Our front office staff cannot provide observation of children in our lobby area while testing is being conducted. An adult accompanying a patient to their appointment will only be allowed in the ultrasound room at the discretion of the ultrasound technician under special circumstances. We apologize for any inconvenience. Your physician has recommended that you wear a two week monitor. The monitors are medical devices that record the heart's electrical activity. Doctors most often us  these monitors to diagnose arrhythmias. Arrhythmias are problems with the speed or rhythm of the heartbeat. The  monitor is a small, portable device. You can wear one while you do your normal daily activities. This is usually used to diagnose what is causing palpitations/syncope (passing out). Follow-Up: At [facility], you and your health needs are our priority.  As part of our continuing mission to provide you with exceptional heart care, our providers are all part of one team.  This team includes your primary Cardiologist (physician) and Advanced Practice Providers or APPs (Physician Assistants and Nurse Practitioners) who all work together to provide you with the care you need, when you need it.  Your next appointment:   1 year(s)  Provider:   Newman JINNY Lawrence, MD    We recommend signing up for the patient portal called MyChart.  Sign up information is provided on this After Visit Summary.  MyChart is used to connect with patients for Virtual Visits (Telemedicine).  Patients are able to view lab/test results, encounter notes, upcoming appointments, etc.  Non-urgent messages can be sent to your provider as well.   To learn more about what you can do with MyChart, go to [email or web address].   Other Instructions ZIO XT- Long Term Monitor Instructions  Your physician has requested you wear a ZIO patch monitor for 14 days.  This is a single patch monitor. [employer] supplies one patch monitor per enrollment. Additional stickers are not available. Please do not apply patch if you will be having a Nuclear Stress Test,  Echocardiogram, Cardiac CT, MRI, or Chest Xray during the period you would be wearing the  monitor. The patch cannot be worn during these tests. You cannot remove and re-apply the  ZIO XT patch monitor.  Your ZIO patch monitor will be mailed 3 day USPS to your address on file. It may take 3-5 days  to receive your monitor after you have been enrolled.  Once you have received your monitor, please review the enclosed instructions. Your monitor  has already been registered  assigning a specific monitor serial # to you.  Billing and Patient Assistance Program Information  We have supplied [employer] with any of your insurance information on file for billing purposes. [employer] offers a sliding scale Patient Assistance Program for patients that do not have  insurance, or whose insurance does not completely cover the cost of the ZIO monitor.  You must apply for the Patient Assistance Program to qualify for this discounted rate.  To apply, please call [employer] at [phone], select option 4, select option 2, ask to apply for  Patient Assistance Program. Meredeth will ask your household income, and how many people  are in your household. They will quote your out-of-pocket cost based on that information.  [employer] will also be able to set up a 28-month, interest-free payment plan if needed.  Applying the monitor   Shave hair from upper left chest.  Hold abrader disc by orange tab. Rub abrader in 40 strokes over the upper left chest as  indicated in your monitor instructions.  Clean area with 4 enclosed alcohol pads. Let dry.  Apply patch as indicated in monitor instructions. Patch will be placed under collarbone on left  side of chest with arrow pointing upward.  Rub patch adhesive wings for 2 minutes. Remove white label marked 1. Remove the white  label marked 2. Rub patch adhesive wings for 2 additional minutes.  While looking in a mirror, press and release button in center of patch. A small green light will  flash 3-4 times. This will be your only indicator that the monitor has been turned on.  Do not shower for the first 24 hours. You may shower after the first 24 hours.  Press the button if you feel a symptom. You will hear a small click. Record Date, Time and  Symptom in the Patient Logbook.  When you are ready to remove the patch, follow instructions on the last 2 pages of Patient  Logbook. Stick patch monitor onto the last page of Patient Logbook.  Place  Patient Logbook in the blue and white box. Use locking tab on box and tape box closed  securely. The blue and white box has prepaid postage on it. Please place it in the mailbox as  soon as possible. Your physician should have your test results approximately 7 days after the  monitor has been mailed back to [facility].  Call [facility] Customer Care at [phone] if you have questions regarding  your ZIO XT patch monitor. Call them immediately if you see an orange light blinking on your  monitor.  If your monitor falls off in less than 4 days, contact our Monitor department at [phone].  If your monitor becomes loose or falls off after 4 days call [employer] at [phone] for  suggestions on securing your monitor Diet & Lifestyle recommendations:  Physical activity recommendation (The Physical Activity Guidelines for Americans. JAMA 2018;Nov 12) At least 150-300 minutes a week of moderate-intensity, or 75-150 minutes a week of vigorous-intensity aerobic physical activity, or an equivalent combination of moderate- and vigorous-intensity aerobic activity. Adults should perform muscle-strengthening activities on 2 or more days a week. Older adults should do multicomponent physical activity that includes balance training as well  as aerobic and muscle-strengthening activities. Benefits of increased physical activity include lower risk of mortality including cardiovascular mortality, lower risk of cardiovascular events and associated risk factors (hypertension and diabetes), and lower risk of many cancers (including bladder, breast, colon, endometrium, esophagus, kidney, lung, and stomach). Additional improvments have been seen in cognition, risk of dementia, anxiety and depression, improved bone health, lower risk of falls, and associated injuries.  Dietary recommendation The 2019 ACC/AHA guidelines promote nutrition as a main fixture of cardiovascular wellness, with a recommendation for a  varied diet of fruit, vegetables, fish, legumes, and whole grains (Class I), as well as recommendations to reduce sodium, cholesterol, processed meats, and refined sugars (Class IIa recommendation).10 Sodium intake, a topic of some controversy as of late, is recommended to be kept at 1,500 mg/day or less, far below the average daily intake in the [place]  of 3,409 mg/day, and notably below that of previous US  recommendations for 300mg /day.10,11 For those unable to reach 1,500 mg/day, they recommend at least a reduction of 1000 mg/day.  A Pesco-Mediterranean Diet With Intermittent Fasting: JACC Review Topic of the Week. J Am Coll Cardiol 2020;76:1484-1493 Pesco-Mediterranean diet, it is supplemented with extra-virgin olive oil (EVOO), which is the principle fat source, along with moderate amounts of dairy (particularly yogurt and cheese) and eggs, as well as modest amounts of alcohol consumption (ideally red wine with the evening meal), but few red and processed meats.

## 2023-06-27 NOTE — Progress Notes (Signed)
 Cardiology Office Note:  .   Date:  06/27/2023  ID:  [name], DOB 24-Oct-1945, MRN [id number] PCP: Vernon Velna SAUNDERS, MD  [facility] Providers Cardiologist:  Newman Lawrence, MD PCP: Vernon Velna SAUNDERS, MD  Chief Complaint  Patient presents with   PVC     Bailey Burns is a 78 y.o. female with hypothyroidism, PSVT, NSVT, symptomatic PVCs  History of Present Illness  Patient is doing well.  She been active until her mechanical fall recently, fortunately did not have any major injuries.  She denies any chest pain, shortness of breath symptoms.  Her palpitations are only occasional at this point.  Reviewed recent lab results with the patient, details below.     Vitals:   06/27/23 0945 06/27/23 1012  BP: (!) 139/50 138/72  Pulse: 66   SpO2: 94%       Review of Systems  Cardiovascular:  Negative for chest pain, dyspnea on exertion, leg swelling, palpitations and syncope.        Studies Reviewed: SABRA        EKG 06/27/2023: Sinus rhythm with frequent Premature ventricular complexes Nonspecific ST and T wave abnormality No previous ECGs available    Labs 11/2022: Chol 221, TG 138, HDL 47, LDL 149 Hb 14.3 TSH 1.6  Echocardiogram 04/2022: Normal LV systolic function with visual EF 60-65%. Left ventricle cavity is normal in size. Normal left ventricular wall thickness. Sigmoid septum. Normal global wall motion. Normal diastolic filling pattern, normal LAP. Calculated EF 61%. Structurally normal mitral valve.  Mild (Grade I) mitral regurgitation. Structurally normal tricuspid valve with trace regurgitation. No evidence of pulmonary hypertension. No prior available for comparison.    Mobile cardiac telemetry 13 days 02/11/2022 - 02/24/2022: Dominant rhythm: Sinus. HR 59-157 bpm. Avg HR 81 bpm, in sinus rhythm. 108 episodes of SVT/atrial tachycardia, fastest at 188 bpm for 9 beats, longest for 13.8 secs at 141 bpm. <1% isolated SVE,  couplet/triplets. 13 episodes of VT, fastest at 169 bpm for 5 beats, longest for 8 beats at 106 bpm. 13.2% isolated VE, 1.2 % couplets, 1% triplets. No atrial fibrillation/atrial flutter/high grade AV block, sinus pause >3sec noted. 43 patient triggered events, correlated with VE    Physical Exam Vitals and nursing note reviewed.  Constitutional:      General: She is not in acute distress. Neck:     Vascular: No JVD.   Cardiovascular:     Rate and Rhythm: Normal rate and regular rhythm.     Heart sounds: Normal heart sounds. No murmur heard. Pulmonary:     Effort: Pulmonary effort is normal.     Breath sounds: Normal breath sounds. No wheezing or rales.   Musculoskeletal:     Right lower leg: No edema.     Left lower leg: No edema.      VISIT DIAGNOSES:   ICD-10-CM   1. 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 NSVT (nonsustained ventricular tachycardia) (HCC)  I47.29 EKG 12-Lead    3. PSVT (paroxysmal supraventricular tachycardia) (HCC)  I47.10 EKG 12-Lead    4. Mixed hyperlipidemia  E78.2     5. Primary hypertension  I10        Bailey Burns is a 78 y.o. female with hypothyroidism, PSVT, NSVT, symptomatic PVCs Assessment & Plan  PSVT, NSVT, PVC: Still has frequent PVCs on EKG today. Continue metoprolol  tartrate 12.5 mg bid. Will check 1 week Zio to assess PVC burden and echocardiogram to ensure preserved  EF given rather high PVC percentage.   Hypertension: Generally better than this. No change made today.  Mixed hyperlipidemia: LDL >140. In addition to heart healthy diet and lifestyle, increases Lipitor to 340 mg daily. Goal LDL <70. She will have lipid panel checked with PCP in a few months.   Meds ordered this encounter  Medications   metoprolol  tartrate (LOPRESSOR ) 25 MG tablet    Sig: Take 0.5 tablets (12.5 mg total) by mouth 2 (two) times daily.    Dispense:  90 tablet    Refill:  3   atorvastatin (LIPITOR) 40 MG tablet    Sig: Take 1 tablet  (40 mg total) by mouth daily.    Dispense:  9 tablet    Refill:  3     F/u in 1 year. At that time, if things are stable, I will see her as needed.  Signed, Newman JINNY Lawrence, MD

## 2023-06-30 ENCOUNTER — Ambulatory Visit: Payer: Self-pay | Admitting: Cardiology

## 2023-07-30 DIAGNOSIS — I471 Supraventricular tachycardia, unspecified: Secondary | ICD-10-CM | POA: Diagnosis not present

## 2023-07-30 DIAGNOSIS — I493 Ventricular premature depolarization: Secondary | ICD-10-CM

## 2023-07-30 DIAGNOSIS — I4729 Other ventricular tachycardia: Secondary | ICD-10-CM | POA: Diagnosis not present

## 2023-07-31 ENCOUNTER — Ambulatory Visit: Payer: Self-pay | Admitting: Cardiology

## 2023-07-31 NOTE — Progress Notes (Signed)
 PVC percentage slightly better than before. Echocardiogram pending. Could consider increasing metoprolol  dose and also frequency for ease of administration.  Options: Increase metoprolol  tartrate from 12.5 mg bid to 25 mg bid OR Change to metoprolol  succinate 50 mg daily.  Thanks MJP

## 2023-08-04 NOTE — Progress Notes (Signed)
 We could change to metoprolol  succinate 25 mg at night for ease of administration.  Thanks MJP

## 2023-08-05 NOTE — Progress Notes (Signed)
 Noted.  Thanks MJP

## 2023-08-10 ENCOUNTER — Ambulatory Visit (HOSPITAL_COMMUNITY)
Admission: RE | Admit: 2023-08-10 | Discharge: 2023-08-10 | Disposition: A | Discharge status: Home / Self Care | Source: Ambulatory Visit | Attending: Cardiovascular Disease | Admitting: Cardiovascular Disease

## 2023-08-10 ENCOUNTER — Encounter: Payer: Self-pay | Admitting: Obstetrics and Gynecology

## 2023-08-10 ENCOUNTER — Telehealth: Payer: Self-pay | Admitting: Cardiology

## 2023-08-10 DIAGNOSIS — I10 Essential (primary) hypertension: Secondary | ICD-10-CM | POA: Diagnosis not present

## 2023-08-10 DIAGNOSIS — I493 Ventricular premature depolarization: Secondary | ICD-10-CM | POA: Diagnosis present

## 2023-08-10 DIAGNOSIS — I349 Nonrheumatic mitral valve disorder, unspecified: Secondary | ICD-10-CM

## 2023-08-10 LAB — ECHOCARDIOGRAM COMPLETE
Area-P 1/2: 3.37 cm2
S' Lateral: 2.3 cm

## 2023-08-10 NOTE — Telephone Encounter (Signed)
 Reviewed the echocardiogram.  Mild prolapse with only mild mitral regurgitation.  Otherwise, normal heart function.  I called the patient, and went to voicemail.  I will try again tomorrow.  Thanks MJP

## 2023-08-10 NOTE — Telephone Encounter (Signed)
 I looked at previous echo. I see that there was mild mitral regurgitation and trace tricuspid regurgitation, but other wise noted as normal in 2024:   Conclusion from echo on 04/30/2022:

## 2023-08-10 NOTE — Telephone Encounter (Signed)
 Echo has not been read yet, but she would like for you to contact her when you echo is finalized.

## 2023-08-10 NOTE — Telephone Encounter (Signed)
 Patient stopped by my desk on her way out from her Echo and she is interested in the possibility of seeking a referral to another general cardiologist as she was told in the Echo that she had a leaky heart valve and this is the first time she is hearing about this issue - she is afraid there may be a communication issue with Dr. Elmira.  Please reach out to this patient to continue this conversation.

## 2023-08-10 NOTE — Telephone Encounter (Signed)
 Noted

## 2023-08-10 NOTE — Telephone Encounter (Signed)
 Sorry to hear this. Leaky mitral valve is an incidental finding and it is only mild and clinically non significant.  I do not think this will have any clinical implications.  Echocardiogram from today is not formally read yet.  I looked at the images and it does not look significantly increase in severity either.  I am happy to personally discuss with the patient after formal echocardiogram read is available from today.  Please let me know if that is okay.  Thanks MJP   Thanks MJP

## 2023-08-10 NOTE — Telephone Encounter (Signed)
 Yes, will do.  Thanks MJP

## 2023-08-10 NOTE — Telephone Encounter (Signed)
 Yes. I reviewed the 2024 echo, she also had one today.

## 2023-08-11 NOTE — Telephone Encounter (Signed)
 Called at this time. No answer.  Thanks MJP

## 2023-08-12 NOTE — Telephone Encounter (Signed)
 fyi

## 2023-08-12 NOTE — Telephone Encounter (Signed)
 Pt returning call

## 2023-08-12 NOTE — Telephone Encounter (Signed)
 Spoke to patient echo results listed below given.I will let Dr.Patwardhan's RN know I spoke to you.

## 2023-08-17 ENCOUNTER — Encounter: Payer: Self-pay | Admitting: Radiology

## 2023-08-24 ENCOUNTER — Telehealth: Payer: Self-pay | Admitting: Cardiology

## 2023-08-24 NOTE — Telephone Encounter (Signed)
 Pt is requesting a provider switch from Dr. Elmira to Dr. Verlin. Please confirm.

## 2023-08-25 NOTE — Telephone Encounter (Signed)
 I have followed Mr. Wiginton for a while primarily for symptomatic PVCs that have been improved with medical treatment.  Patient was upset with me that I did not notify her of a leaky valve that she found out about during echocardiographic examination.  Mitral regurgitation has been mild on echocardiogram in 04/2022, and also on 08/2023 with mild prolapse. Knowing that patient was upset, I had called the patient personally twice to discuss these findings, but patient was not reach her on the phone.  I am happy to see her for follow-up, but respect her wishes.  Thanks MJP

## 2023-09-13 ENCOUNTER — Encounter: Payer: Self-pay | Admitting: Obstetrics and Gynecology

## 2023-09-22 ENCOUNTER — Other Ambulatory Visit: Payer: Self-pay | Admitting: Internal Medicine

## 2023-09-22 DIAGNOSIS — Z1231 Encounter for screening mammogram for malignant neoplasm of breast: Secondary | ICD-10-CM

## 2023-10-24 ENCOUNTER — Ambulatory Visit
Admission: RE | Admit: 2023-10-24 | Discharge: 2023-10-24 | Disposition: A | Discharge status: Home / Self Care | Source: Ambulatory Visit | Attending: Internal Medicine | Admitting: Internal Medicine

## 2023-10-24 DIAGNOSIS — Z1231 Encounter for screening mammogram for malignant neoplasm of breast: Secondary | ICD-10-CM

## 2023-12-14 ENCOUNTER — Encounter: Payer: Self-pay | Admitting: Cardiovascular Disease

## 2023-12-14 ENCOUNTER — Ambulatory Visit: Attending: Cardiology | Admitting: Cardiovascular Disease

## 2023-12-14 VITALS — BP 166/92 | HR 63 | Ht 64.0 in | Wt 123.0 lb

## 2023-12-14 DIAGNOSIS — I471 Supraventricular tachycardia, unspecified: Secondary | ICD-10-CM

## 2023-12-14 DIAGNOSIS — I493 Ventricular premature depolarization: Secondary | ICD-10-CM | POA: Diagnosis not present

## 2023-12-14 DIAGNOSIS — I4729 Other ventricular tachycardia: Secondary | ICD-10-CM | POA: Diagnosis not present

## 2023-12-14 DIAGNOSIS — I10 Essential (primary) hypertension: Secondary | ICD-10-CM

## 2023-12-14 DIAGNOSIS — E782 Mixed hyperlipidemia: Secondary | ICD-10-CM

## 2023-12-14 NOTE — Progress Notes (Signed)
 Chief Complaint  Patient presents with   Follow-up    SVT, PVCs   History of Present Illness: 78 yo female with history of HLD, HTN, hypothyroidism, PVCs, SVT and non-sustained VT who is here today for cardiac follow up. She has been followed in our office by Dr. Elmira. Cardiac monitor in July 2025 with sinus, 11 runs of SVT (longest 16 seconds), 7 runs of VT (longest 7 beats), frequent PVCs (10% burden). Echo August 2025 with LVEF=60-65%. Mild MR. She has been on Toprol . This was increased after cardiac monitor results in July 2025.   She is here today for follow up. The patient denies any chest pain, dyspnea, palpitations, lower extremity edema, orthopnea, PND, dizziness, near syncope or syncope.   Primary Care Physician: Vernon Velna SAUNDERS, MD   Past Medical History:  Diagnosis Date   Basal cell carcinoma 08/11/2015   nodular- Left Jawline- clear   BCC (basal cell carcinoma of skin) 11/06/2018   Infundibulocystic-Left Bulb of Nose-MOHS   BCC (basal cell carcinoma of skin) 11/06/2018   Nodular-Left Nostril Junction-MOHS   Hypercholesterolemia    history of   Hypertension    Hypothyroidism    NSVT (nonsustained ventricular tachycardia) (HCC)    PVC (premature ventricular contraction)    Squamous cell carcinoma of skin 03/27/2015   [facility] upper chest- c x 3,5FU   SVT (supraventricular tachycardia)     Past Surgical History:  Procedure Laterality Date   COLONOSCOPY WITH PROPOFOL  N/A 04/23/2014   Procedure: COLONOSCOPY WITH PROPOFOL ;  Surgeon: Gladis MARLA Louder, MD;  Location: [facility];  Service: Endoscopy;  Laterality: N/A;   DIAGNOSTIC LAPAROSCOPY     ruled out endometriosis   DILATION AND CURETTAGE OF UTERUS     TONSILLECTOMY      Current Outpatient Medications  Medication Sig Dispense Refill   atorvastatin  (LIPITOR) 40 MG tablet Take 1 tablet (40 mg total) by mouth daily. 90 tablet 3   Calcium  Carbonate-Vitamin D (CALCIUM  600 +D HIGH POTENCY PO) Take by  mouth.     citalopram (CELEXA) 20 MG tablet Take 20 mg by mouth daily.     estrogens, conjugated, (PREMARIN) 0.625 MG tablet Take 0.625 mg by mouth daily. Take daily for 21 days then do not take for 7 days.     levothyroxine (SYNTHROID) 25 MCG tablet Take 12.5 mcg by mouth daily before breakfast.     metoprolol  tartrate (LOPRESSOR ) 25 MG tablet Take 0.5 tablets (12.5 mg total) by mouth 2 (two) times daily. 90 tablet 3   Multiple Vitamin (MULTIVITAMIN WITH MINERALS) TABS tablet Take 1 tablet by mouth daily.     valsartan (DIOVAN) 320 MG tablet Take 320 mg by mouth daily.     valsartan-hydrochlorothiazide (DIOVAN-HCT) 160-12.5 MG per tablet Take 1 tablet by mouth every evening. 04-11-14 recently placed on Valsartan without Hydrochlorthiazide     No current facility-administered medications for this visit.    No Known Allergies  Social History   Socioeconomic History   Marital status: Single    Spouse name: Not on file   Number of children: Not on file   Years of education: Not on file   Highest education level: Not on file  Occupational History   Not on file  Tobacco Use   Smoking status: Never   Smokeless tobacco: Never  Vaping Use   Vaping status: Never Used  Substance and Sexual Activity   Alcohol use: Yes    Comment: rare occ.   Drug use: No  Sexual activity: Yes  Other Topics Concern   Not on file  Social History Narrative   Not on file   Social Drivers of Health   Financial Resource Strain: Not on file  Food Insecurity: Not on file  Transportation Needs: Not on file  Physical Activity: Not on file  Stress: Not on file  Social Connections: Not on file  Intimate Partner Violence: Not on file    Family History  Problem Relation Age of Onset   Hypertension Mother    Hypertension Father    Cancer Sister    Stroke Maternal Grandmother    Stroke Maternal Grandfather    Stroke Paternal Grandfather     Review of Systems:  As stated in the HPI and otherwise  negative.   BP (!) 166/92 (BP Location: Left Arm)   Pulse 63   Ht 5' 4 (1.626 m)   Wt 123 lb (55.8 kg)   SpO2 96%   BMI 21.11 kg/m   Physical Examination: General: Well developed, well nourished, NAD  HEENT: OP clear, mucus membranes moist  SKIN: warm, dry. No rashes. Neuro: No focal deficits  Musculoskeletal: Muscle strength 5/5 all ext  Psychiatric: Mood and affect normal  Neck: No JVD, no carotid bruits, no thyromegaly, no lymphadenopathy.  Lungs:Clear bilaterally, no wheezes, rhonci, crackles Cardiovascular: Regular rate and rhythm. No murmurs, gallops or rubs. Abdomen:Soft. Bowel sounds present. Non-tender.  Extremities: No lower extremity edema. Pulses are 2 + in the bilateral DP/PT.  EKG:  EKG is not ordered today. The ekg ordered today demonstrates   Recent Labs: No results found for requested labs within last 365 days.   Lipid Panel No results found for: CHOL, TRIG, HDL, CHOLHDL, VLDL, LDLCALC, LDLDIRECT   Wt Readings from Last 3 Encounters:  12/14/23 123 lb (55.8 kg)  06/27/23 121 lb 9.6 oz (55.2 kg)  07/02/22 120 lb (54.4 kg)    Assessment and Plan:   1. SVT/NSVT/PVCs: Rare palpitations. Normal LV function by echo in August 2025.  Continue Toprol .   2. HTN: BP is well controlled. Continue current therapy  3. Mitral regurgitation: Mild by echo in August 2025.   4. Hyperlipidemia: LDL 79 in November 2025. No known vascular disease. Continue Lipitor.   Labs/ tests ordered today include:  No orders of the defined types were placed in this encounter.  Disposition:   F/U with me in one year  Signed, Lonni Cash, MD, [facility] 12/14/2023 4:02 PM    [facility] [address] [place], [place], [place]  [address] Phone: [phone]; Fax: [phone]

## 2023-12-14 NOTE — Patient Instructions (Signed)
